# Patient Record
Sex: Male | Born: 1970 | Race: Asian | Hispanic: No | Marital: Married | Smoking: Former smoker
Health system: Southern US, Community
[De-identification: ages and names within clinical notes are randomized; demographics above are authoritative.]

## PROBLEM LIST (undated history)

## (undated) DIAGNOSIS — I10 Essential (primary) hypertension: Secondary | ICD-10-CM

## (undated) DIAGNOSIS — B1910 Unspecified viral hepatitis B without hepatic coma: Secondary | ICD-10-CM

## (undated) DIAGNOSIS — R079 Chest pain, unspecified: Secondary | ICD-10-CM

## (undated) DIAGNOSIS — K7460 Unspecified cirrhosis of liver: Secondary | ICD-10-CM

## (undated) HISTORY — DX: Unspecified viral hepatitis B without hepatic coma: K74.60

## (undated) HISTORY — DX: Unspecified viral hepatitis B without hepatic coma: B19.10

## (undated) HISTORY — DX: Essential (primary) hypertension: I10

## (undated) HISTORY — DX: Chest pain, unspecified: R07.9

---

## 2001-03-20 ENCOUNTER — Emergency Department (HOSPITAL_COMMUNITY): Admission: EM | Admit: 2001-03-20 | Discharge: 2001-03-20 | Payer: Self-pay | Admitting: *Deleted

## 2002-05-04 ENCOUNTER — Emergency Department (HOSPITAL_COMMUNITY): Admission: EM | Admit: 2002-05-04 | Discharge: 2002-05-04 | Payer: Self-pay | Admitting: Emergency Medicine

## 2002-05-15 ENCOUNTER — Emergency Department (HOSPITAL_COMMUNITY): Admission: EM | Admit: 2002-05-15 | Discharge: 2002-05-15 | Payer: Self-pay | Admitting: Emergency Medicine

## 2005-12-21 ENCOUNTER — Ambulatory Visit (HOSPITAL_COMMUNITY): Admission: RE | Admit: 2005-12-21 | Discharge: 2005-12-21 | Payer: Self-pay | Admitting: Gastroenterology

## 2006-12-11 ENCOUNTER — Emergency Department (HOSPITAL_COMMUNITY): Admission: EM | Admit: 2006-12-11 | Discharge: 2006-12-12 | Payer: Self-pay | Admitting: Emergency Medicine

## 2012-03-06 ENCOUNTER — Ambulatory Visit (INDEPENDENT_AMBULATORY_CARE_PROVIDER_SITE_OTHER): Payer: 59 | Admitting: Family Medicine

## 2012-03-06 VITALS — BP 126/84 | HR 72 | Temp 97.5°F | Resp 16 | Ht 62.75 in | Wt 140.0 lb

## 2012-03-06 DIAGNOSIS — J029 Acute pharyngitis, unspecified: Secondary | ICD-10-CM

## 2012-03-06 LAB — POCT RAPID STREP A (OFFICE): Rapid Strep A Screen: NEGATIVE

## 2012-03-06 MED ORDER — FLUTICASONE PROPIONATE 50 MCG/ACT NA SUSP: 2.0000 | Freq: Every day | NASAL | Status: DC

## 2012-03-06 NOTE — Progress Notes (Signed)
  Patient Name: Brian Preston Date of Birth: July 31, 1971 Medical Record Number: [id number] Gender: male Date of Encounter: 03/06/2012  History of Present Illness:  Brian Preston is a 41 y.o. very pleasant male patient who presents with the following:  Here with ST for 2 weeks.  Hurts the most in the morning and at night.  No sneezing, no itchy eyes, no cough, "a little bit" of subjective fever.  No nausea or vomiting. He tried zyrtec thinking that he might have allergies but this did not help.  Otherwise he is generally healthy and has no other issues currently  There is no problem list on file for this patient.  No past medical history on file. No past surgical history on file. History  Substance Use Topics  . Smoking status: Former Games developer  . Smokeless tobacco: Not on file  . Alcohol Use: Not on file   No family history on file. No Known Allergies  Medication list has been reviewed and updated.  Review of Systems: As per HPI- otherwise negative.   Physical Examination: Filed Vitals:   03/06/12 1255  BP: 126/84  Pulse: 72  Temp: 97.5 F (36.4 C)  TempSrc: Oral  Resp: 16  Height: 5' 2.75" (1.594 m)  Weight: 140 lb (63.504 kg)    Body mass index is 25.00 kg/(m^2).  GEN: WDWN, NAD, Non-toxic, A & O x 3 HEENT: Atraumatic, Normocephalic. Neck supple. No masses, No LAD.  Tm and oropharynx wnl.  No exudate or redness of tonsils Ears and Nose: No external deformity. CV: RRR, No M/G/R. No JVD. No thrill. No extra heart sounds. PULM: CTA B, no wheezes, crackles, rhonchi. No retractions. No resp. distress. No accessory muscle use EXTR: No c/c/e NEURO Normal gait.  PSYCH: Normally interactive. Conversant. Not depressed or anxious appearing.  Calm demeanor.  Results for orders placed in visit on 03/06/12  POCT RAPID STREP A (OFFICE)      Component Value Range   Rapid Strep A Screen Negative  Negative     Assessment and Plan: 1. Acute pharyngitis  POCT rapid strep A, fluticasone  (FLONASE) 50 MCG/ACT nasal spray  2. Allergic rhinitis     Suspect allergies are indeed the cause of ST.  Gave Rx for DMM, and flonase as above.  Let me know if not better in a few days- Sooner if worse.

## 2012-04-17 ENCOUNTER — Ambulatory Visit (INDEPENDENT_AMBULATORY_CARE_PROVIDER_SITE_OTHER): Payer: 59 | Admitting: Family Medicine

## 2012-04-17 VITALS — BP 103/71 | HR 109 | Temp 98.6°F | Resp 16 | Ht 62.0 in | Wt 137.0 lb

## 2012-04-17 DIAGNOSIS — R112 Nausea with vomiting, unspecified: Secondary | ICD-10-CM

## 2012-04-17 LAB — POCT CBC
Granulocyte percent: 81.3 %G — AB (ref 37–80)
HCT, POC: 41.1 % — AB (ref 43.5–53.7)
Hemoglobin: 13.3 g/dL — AB (ref 14.1–18.1)
Lymph, poc: 1.8 (ref 0.6–3.4)
MCH, POC: 28.7 pg (ref 27–31.2)
MCHC: 32.4 g/dL (ref 31.8–35.4)
MCV: 88.6 fL (ref 80–97)
MID (cbc): 0.9 (ref 0–0.9)
MPV: 9.3 fL (ref 0–99.8)
POC Granulocyte: 11.9 — AB (ref 2–6.9)
POC LYMPH PERCENT: 12.2 %L (ref 10–50)
POC MID %: 6.5 %M (ref 0–12)
Platelet Count, POC: 241 10*3/uL (ref 142–424)
RBC: 4.64 M/uL — AB (ref 4.69–6.13)
RDW, POC: 14.1 %
WBC: 14.6 10*3/uL — AB (ref 4.6–10.2)

## 2012-04-17 LAB — GLUCOSE, POCT (MANUAL RESULT ENTRY): POC Glucose: 84 mg/dl (ref 70–99)

## 2012-04-17 MED ORDER — ONDANSETRON 8 MG PO TBDP: 8.0000 mg | ORAL_TABLET | Freq: Three times a day (TID) | ORAL | Status: AC | PRN

## 2012-04-17 MED ORDER — SODIUM CHLORIDE 0.9 % IV SOLN: 4.0000 mg | Freq: Once | INTRAVENOUS | Status: DC

## 2012-04-17 NOTE — Progress Notes (Signed)
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 He's having some crampy abdominal pain and feels dizzy when he stands up.  He's noted no blood in either vomitus or his diarrhea and his abdominal pain is tolerable, but he does feel weak and dizzy when standing.  He has no cough, sore throat, chest pain, or headache  Objective: Patient appears acutely ill and is lying supine on the exam table HEENT: Unremarkable Chest: Clear Heart: Rapid, no murmur or gallop Abdomen: Hyperactive bowel sounds, soft with mild tenderness diffusely, no masses, no HSM Skin: No rash, ecchymosis Neurological: Neck is supple, patient is alert and cooperative, cranial nerves III through XII are intact  Results for orders placed in visit on 04/17/12  POCT CBC      Component Value Range   WBC 14.6 (*) 4.6 - 10.2 (K/uL)   Lymph, poc 1.8  0.6 - 3.4    POC LYMPH PERCENT 12.2  10 - 50 (%L)   MID (cbc) 0.9  0 - 0.9    POC MID % 6.5  0 - 12 (%M)   POC Granulocyte 11.9 (*) 2 - 6.9    Granulocyte percent 81.3 (*) 37 - 80 (%G)   RBC 4.64 (*) 4.69 - 6.13 (M/uL)   Hemoglobin 13.3 (*) 14.1 - 18.1 (g/dL)   HCT, POC 16.1 (*) 09.6 - 53.7 (%)   MCV 88.6  80 - 97 (fL)   MCH, POC 28.7  27 - 31.2 (pg)   MCHC 32.4  31.8 - 35.4 (g/dL)   RDW, POC 04.5     Platelet Count, POC 241  142 - 424 (K/uL)   MPV 9.3  0 - 99.8 (fL)  GLUCOSE, POCT (MANUAL RESULT ENTRY)      Component Value Range   POC Glucose 84  70 - 99 (mg/dl)   Assessment: Acute dehydration with gastroenteritis. No reflex present at this particular time.  Plan: Keep out of work for next 24 hours, clear liquids, Zofran for control of nausea.  Patient told to return if he develops bleeding per rectum or per os, 1. Nausea, vomiting, and diarrhea  POCT CBC, POCT glucose (manual entry), Comprehensive metabolic panel, ondansetron (ZOFRAN) 4 mg in sodium chloride 0.9 % 50 mL IVPB, ondansetron (ZOFRAN  ODT) 8 MG disintegrating tablet

## 2012-04-17 NOTE — Patient Instructions (Signed)
Bu?n Nn v Nn  (Nausea and Vomiting) Bu?n nn l m?t c?m gic b? b?nh th??ng xu?t hi?n tr??c khi nn (i m?a). Nn l m?t ph?n x? trong ? cc ch?t trong d? dy ?i ra kh?i mi?ng c?a b?n. Nn c th? gy ra m?t d?ch c? th? nghim tr?ng (m?t n??c). Tr? em v ng??i cao tu?i c th? b? m?t n??c nhanh chng, ??c bi?t n?u h? b? c? tiu ch?y. Bu?n nn v nn l tri?u ch?ng c?a m?t tnh tr?ng ho?c b?nh. ?i?u quan tr?ng l tm ra nguyn nhn c?a cc tri?u ch?ng.  NGUYN NHN   Tr?c ti?p kch thch nim m?c d? dy. S? kch thch ny c th? do t?ng l??ng axit (tro ng??c d? dy b?nh), nhi?m trng, ng? ??c th?c ph?m, dng m?t s? lo?i thu?c (nh? thu?c ch?ng vim khng c steroid), s? d?ng r??u ho?c s? d?ng thu?c l.   Tn hi?u t? no. Nh?ng tn hi?u ny c th? gy ra b?i ?au ??u, ti?p xc v?i nhi?t, r?i lo?n tai trong, t?ng p l?c trong no sau ch?n th??ng, nhi?m trng, kh?i u ho?c ch?n ??ng, ?au, kch thch c?m xc ho?c cc v?n ?? trao ??i ch?t.   S? t?c ngh?n ? ???ng tiu ha (t?c ngh?n ru?t).   Cc b?nh nh? ti?u ???ng, vim gan, v?n ?? v? ti m?t, vim ru?t th?a, v?n ?? v? th?n, ung th?, nhi?m khu?n huy?t, cc tri?u ch?ng khng ?i?n hnh c?a m?t c?n ?au tim ho?c r?i lo?n ?n u?ng.   ?i?u tr? y t? nh? ha tr? v x? tr?.   Nh?n thu?c lm cho b?n ng? (gy m ton thn) trong khi ph?u thu?t.  CH?N ?ON  Chuyn gia ch?m sc y t? c th? yu c?u ti?n hnh cc xt nghi?m n?u v?n ?? khng c?i thi?n sau m?t vi ngy. Cc xt nghi?m c?ng c th? ???c th?c hi?n n?u c cc tri?u ch?ng n?ng ho?c n?u l do gy bu?n nn v nn khng r rng. Cc xt nghi?m c th? bao g?m:   Xt nghi?m n??c ti?u.   Xt nghi?m mu.   Xt nghi?m phn.   C?y m?u (?? tm b?ng ch?ng nhi?m trng).   X-quang ho?c cc ch?n ?on hnh ?nh khc.  K?t qu? xt nghi?m c th? gip chuyn gia ch?m sc y t? c?a b?n ??a ra Gamble?t ??nh v? ?i?u tr? ho?c s? c?n thi?t ph?i th?c hi?n thm cc xt nghi?m khc.  ?I?U TR?  B?n c?n gi? ? tr?ng thi ng?m n??c  t?t. U?ng th??ng xuyn nh?ng v?i s? l??ng nh?. B?n c th? mu?n u?ng n??c, ?? u?ng th? thao, n??c dng trong, ho?c ?n kem ?ng l?nh ho?c mn trng mi?ng gelatin ?? gi? ng?m n??c. Khi b?n ?n, u?ng ch?m c th? gip ng?n ng?a bu?n nn. Ngoi ra cn c m?t s? thu?c ch?ng nn c th? gip ng?n ng?a bu?n nn.  H??NG D?N CH?M SC T?I NH   S? d?ng t?t c? thu?c theo ch? d?n c?a chuyn gia ch?m sc y t?.   N?u b?n khng thm ?n, khng p bu?c mnh ?n. Tuy nhin, b?n ph?i ti?p t?c u?ng n??c.   N?u b?n thm ?n, hy ?n m?t ch? ?? ?n bnh th??ng, tr? khi chuyn gia ch?m sc y t? c?a b?n c ch? d?n khc.   ?n nhi?u lo?i hi?rat [place]bon ph?c t?p (g?o, la m, khoai ty, bnh m), th?t n?c, s?a chua, tri cy v rau qu?.   Trnh   cc lo?i th?c ph?m c hm l??ng ch?t bo cao v chng kh tiu ha h?n.   U?ng ?? n??c v dung d?ch ?? n??c ti?u trong ho?c c mu vng nh?t.   N?u b?n b? m?t n??c, hy h?i chuyn gia ch?m sc y t? c?a b?n ?? ???c h??ng d?n b n??c c? th?. D?u hi?u m?t n??c c th? bao g?m:   R?t kht.   Mi v mi?ng kh.   Hoa m?t.   N??c ti?u ??m mu.   Gi?m t?n su?t v l??ng n??c ti?u.   B? l?n.   Th? d?c ho?c nh?p ??p nhanh.  HY NGAY L?P T?C THAM V?N V?I CHUYN GIA Y T? N?U:   C mu ho?c ??m nu (gi?ng nh? b c ph) trong ch?t nn c?a b?n.   Phn b?n c mu ?en ho?c c mu.   B?n b? ?au ??u ho?c c?ng c? n?ng.   B?n b? l?n.   B?n b? ?au b?ng n?ng.   B?n b? ?au ng?c ho?c kh th?.   B?n khng ?i ti?u t nh?t m?t l?n m?i 8 gi?.   B?n pht tri?n da l?nh.   B?n ti?p t?c nn ko di h?n 24 ??n 48 gi?.   B?n b? s?t.  ??M B?O B?N:   Hi?u cc h??ng d?n ny.   S? theo di tnh tr?ng c?a mnh.   S? yu c?u tr? gip ngay l?p t?c n?u b?n c?m th?y khng kh?e ho?c tnh tr?ng tr? nn t?i h?n.  Document Released: 05/23/2011 Document Revised: 10/19/2011 Exit[employer] Patient Information 2012 [employer]. 

## 2012-04-18 LAB — COMPREHENSIVE METABOLIC PANEL
ALT: 38 U/L (ref 0–53)
AST: 17 U/L (ref 0–37)
Albumin: 3.1 g/dL — ABNORMAL LOW (ref 3.5–5.2)
Alkaline Phosphatase: 43 U/L (ref 39–117)
BUN: 17 mg/dL (ref 6–23)
CO2: 27 mEq/L (ref 19–32)
Calcium: 7.5 mg/dL — ABNORMAL LOW (ref 8.4–10.5)
Chloride: 107 mEq/L (ref 96–112)
Creat: 0.72 mg/dL (ref 0.50–1.35)
Glucose, Bld: 87 mg/dL (ref 70–99)
Potassium: 3.8 mEq/L (ref 3.5–5.3)
Sodium: 140 mEq/L (ref 135–145)
Total Bilirubin: 0.7 mg/dL (ref 0.3–1.2)
Total Protein: 5.5 g/dL — ABNORMAL LOW (ref 6.0–8.3)

## 2012-11-22 ENCOUNTER — Ambulatory Visit (INDEPENDENT_AMBULATORY_CARE_PROVIDER_SITE_OTHER): Payer: 59 | Admitting: Emergency Medicine

## 2012-11-22 VITALS — BP 120/84 | HR 86 | Temp 98.0°F | Resp 16 | Ht 62.5 in | Wt 144.0 lb

## 2012-11-22 DIAGNOSIS — R509 Fever, unspecified: Secondary | ICD-10-CM

## 2012-11-22 DIAGNOSIS — J111 Influenza due to unidentified influenza virus with other respiratory manifestations: Secondary | ICD-10-CM

## 2012-11-22 DIAGNOSIS — R05 Cough: Secondary | ICD-10-CM

## 2012-11-22 DIAGNOSIS — R059 Cough, unspecified: Secondary | ICD-10-CM

## 2012-11-22 DIAGNOSIS — J029 Acute pharyngitis, unspecified: Secondary | ICD-10-CM

## 2012-11-22 LAB — POCT INFLUENZA A/B: Influenza B, POC: NEGATIVE

## 2012-11-22 LAB — POCT CBC
HCT, POC: 48 % (ref 43.5–53.7)
Lymph, poc: 2.9 (ref 0.6–3.4)
MCHC: 31 g/dL — AB (ref 31.8–35.4)
MCV: 91 fL (ref 80–97)
POC LYMPH PERCENT: 29.5 %L (ref 10–50)
RDW, POC: 13.4 %

## 2012-11-22 LAB — POCT RAPID STREP A (OFFICE): Rapid Strep A Screen: NEGATIVE

## 2012-11-22 MED ORDER — OSELTAMIVIR PHOSPHATE 75 MG PO CAPS: 75.0000 mg | ORAL_CAPSULE | Freq: Two times a day (BID) | ORAL | Status: DC

## 2012-11-22 NOTE — Progress Notes (Signed)
Subjective:    Patient ID: Brian Preston, male    DOB: 16-Aug-1971, 42 y.o.   MRN: [id number]  HPI patient enters with flulike symptoms of head congestion sore throat dry cough associated with fever chills and myalgias. He states he did have a flu shot this year     Review of Systems     Objective:   Physical Exam HEENT exam reveals an ill-appearing but alert cooperative male who is not in distress neck is supple. Chest is clear to auscultation and percussion. Cardiac is regular rate without murmurs or gallops  Flu test + Results for orders placed in visit on 04/17/12  POCT CBC      Component Value Range   WBC 14.6 (*) 4.6 - 10.2 K/uL   Lymph, poc 1.8  0.6 - 3.4   POC LYMPH PERCENT 12.2  10 - 50 %L   MID (cbc) 0.9  0 - 0.9   POC MID % 6.5  0 - 12 %M   POC Granulocyte 11.9 (*) 2 - 6.9   Granulocyte percent 81.3 (*) 37 - 80 %G   RBC 4.64 (*) 4.69 - 6.13 M/uL   Hemoglobin 13.3 (*) 14.1 - 18.1 g/dL   HCT, POC 10.2 (*) 72.5 - 53.7 %   MCV 88.6  80 - 97 fL   MCH, POC 28.7  27 - 31.2 pg   MCHC 32.4  31.8 - 35.4 g/dL   RDW, POC 36.6     Platelet Count, POC 241  142 - 424 K/uL   MPV 9.3  0 - 99.8 fL  GLUCOSE, POCT (MANUAL RESULT ENTRY)      Component Value Range   POC Glucose 84  70 - 99 mg/dl  COMPREHENSIVE METABOLIC PANEL      Component Value Range   Sodium 140  135 - 145 mEq/L   Potassium 3.8  3.5 - 5.3 mEq/L   Chloride 107  96 - 112 mEq/L   CO2 27  19 - 32 mEq/L   Glucose, Bld 87  70 - 99 mg/dL   BUN 17  6 - 23 mg/dL   Creat 4.40  3.47 - 4.25 mg/dL   Total Bilirubin 0.7  0.3 - 1.2 mg/dL   Alkaline Phosphatase 43  39 - 117 U/L   AST 17  0 - 37 U/L   ALT 38  0 - 53 U/L   Total Protein 5.5 (*) 6.0 - 8.3 g/dL   Albumin 3.1 (*) 3.5 - 5.2 g/dL   Calcium 7.5 (*) 8.4 - 10.5 mg/dL   Results for orders placed in visit on 11/22/12  POCT INFLUENZA A/B      Component Value Range   Influenza A, POC Negative     Influenza B, POC Negative    POCT RAPID STREP A (OFFICE)   Component Value Range   Rapid Strep A Screen Negative  Negative   Results for orders placed in visit on 11/22/12  POCT INFLUENZA A/B      Component Value Range   Influenza A, POC Negative     Influenza B, POC Negative    POCT RAPID STREP A (OFFICE)      Component Value Range   Rapid Strep A Screen Negative  Negative  POCT CBC      Component Value Range   WBC 9.8  4.6 - 10.2 K/uL   Lymph, poc 2.9  0.6 - 3.4   POC LYMPH PERCENT 29.5  10 - 50 %L   MID (  cbc) 0.7  0 - 0.9   POC MID % 7.3  0 - 12 %M   POC Granulocyte 6.2  2 - 6.9   Granulocyte percent 63.2  37 - 80 %G   RBC 5.28  4.69 - 6.13 M/uL   Hemoglobin 14.9  14.1 - 18.1 g/dL   HCT, POC 78.4  69.6 - 53.7 %   MCV 91.0  80 - 97 fL   MCH, POC 28.2  27 - 31.2 pg   MCHC 31.0 (*) 31.8 - 35.4 g/dL   RDW, POC 29.5     Platelet Count, POC 296  142 - 424 K/uL   MPV 9.5  0 - 99.8 fL      Assessment & Plan:    Patient here with flu symptoms. Has negative flu test but his white count is normal. We'll treat with Tamiflu

## 2012-11-22 NOTE — Patient Instructions (Addendum)
Influenza, Adult Influenza ("the flu") is a viral infection of the respiratory tract. It occurs more often in winter months because people spend more time in close contact with one another. Influenza can make you feel very sick. Influenza easily spreads from person to person (contagious). CAUSES   Influenza is caused by a virus that infects the respiratory tract. You can catch the virus by breathing in droplets from an infected person's cough or sneeze. You can also catch the virus by touching something that was recently contaminated with the virus and then touching your mouth, nose, or eyes. SYMPTOMS   Symptoms typically last 4 to 10 days and may include:  Fever.   Chills.   Headache, body aches, and muscle aches.   Sore throat.   Chest discomfort and cough.   Poor appetite.   Weakness or feeling tired.   Dizziness.   Nausea or vomiting.  DIAGNOSIS   Diagnosis of influenza is often made based on your history and a physical exam. A nose or throat swab test can be done to confirm the diagnosis. RISKS AND COMPLICATIONS You may be at risk for a more severe case of influenza if you smoke cigarettes, have diabetes, have chronic heart disease (such as heart failure) or lung disease (such as asthma), or if you have a weakened immune system. Elderly people and pregnant women are also at risk for more serious infections. The most common complication of influenza is a lung infection (pneumonia). Sometimes, this complication can require emergency medical care and may be life-threatening. PREVENTION   An annual influenza vaccination (flu shot) is the best way to avoid getting influenza. An annual flu shot is now routinely recommended for all adults in the [place] TREATMENT   In mild cases, influenza goes away on its own. Treatment is directed at relieving symptoms. For more severe cases, your caregiver may prescribe antiviral medicines to shorten the sickness. Antibiotic medicines are not effective,  because the infection is caused by a virus, not by bacteria. HOME CARE INSTRUCTIONS  Only take over-the-counter or prescription medicines for pain, discomfort, or fever as directed by your caregiver.   Use a cool mist humidifier to make breathing easier.   Get plenty of rest until your temperature returns to normal. This usually takes 3 to 4 days.   Drink enough fluids to keep your urine clear or pale yellow.   Cover your mouth and nose when coughing or sneezing, and wash your hands well to avoid spreading the virus.   Stay home from work or school until your fever has been gone for at least 1 full day.  SEEK MEDICAL CARE IF:    You have chest pain or a deep cough that worsens or produces more mucus.   You have nausea, vomiting, or diarrhea.  SEEK IMMEDIATE MEDICAL CARE IF:    You have difficulty breathing, shortness of breath, or your skin or nails turn bluish.   You have severe neck pain or stiffness.   You have a severe headache, facial pain, or earache.   You have a worsening or recurring fever.   You have nausea or vomiting that cannot be controlled.  MAKE SURE YOU:  Understand these instructions.   Will watch your condition.   Will get help right away if you are not doing well or get worse.  Document Released: 10/27/2000 Document Revised: 04/30/2012 Document Reviewed: 01/29/2012 Exit[employer] Patient Information 2013 [employer].    

## 2013-03-03 ENCOUNTER — Ambulatory Visit (INDEPENDENT_AMBULATORY_CARE_PROVIDER_SITE_OTHER): Payer: 59 | Admitting: Family Medicine

## 2013-03-03 DIAGNOSIS — M79609 Pain in unspecified limb: Secondary | ICD-10-CM

## 2013-03-03 DIAGNOSIS — M79601 Pain in right arm: Secondary | ICD-10-CM

## 2013-03-03 DIAGNOSIS — Z0000 Encounter for general adult medical examination without abnormal findings: Secondary | ICD-10-CM

## 2013-03-03 MED ORDER — TRAMADOL HCL 50 MG PO TABS: 50.0000 mg | ORAL_TABLET | Freq: Every day | ORAL | Status: DC

## 2013-03-03 NOTE — Patient Instructions (Signed)
It was good to meet you.    We will let you know the lab results.

## 2013-03-03 NOTE — Progress Notes (Signed)
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  Left eye twitching:  Present for past 3 months.  Occurs intermittently throughout the day.  No eye pain, no eye redness, no changes in vision/diplopia.  Does not wear glasses.    2.  Pain:  Present for past several weeks.  Complains of intermittent "bone pain" in legs and arms.  Exacerbated with movement.  No pain in AM when awakens, no pain on days he does not work.  Currently he has Right arm distal to elbow but below shoulder and extending into forearm.  No injury.  Also with BL legs which are painful, but this is dull.  Both arm and legs described as "ache."  Has tried OTC Tylenol with helps at times and also oil massages from his wife which help.  No weight loss, night sweats, fevers/chills.  No change in energy levels.   Also asking for complete physical exam.    PMH reviewed.  History reviewed. No pertinent past medical history. History reviewed. No pertinent past surgical history.  Medications reviewed. Current Outpatient Prescriptions  Medication Sig Dispense Refill  . fluticasone (FLONASE) 50 MCG/ACT nasal spray Place 2 sprays into the nose daily.  16 g  6  . oseltamivir (TAMIFLU) 75 MG capsule Take 1 capsule (75 mg total) by mouth 2 (two) times daily.  10 capsule  0   Current Facility-Administered Medications  Medication Dose Route Frequency Provider Last Rate Last Dose  . ondansetron (ZOFRAN) 4 mg in sodium chloride 0.9 % 50 mL IVPB  4 mg Intravenous Once Elvina Sidle, MD        ROS as above otherwise neg.  No chest pain, palpitations, SOB, Fever, Chills, Abd pain, N/V/D.   Physical Exam:  BP 130/90  Pulse 83  Temp(Src) 98.1 F (36.7 C) (Oral)  Resp 16  Ht 5\' 2"  (1.575 m)  Wt 145 lb (65.772 kg)  BMI 26.51 kg/m2  SpO2 98% Gen:  Alert, cooperative patient who appears stated age in no acute distress.  Vital signs  reviewed. HEENT:  [place]/AT.  EOMI, PERRL.  MMM, tonsils non-erythematous, non-edematous.  External ears WNL, Bilateral TM's normal without retraction, redness or bulging.  Pulm:  Clear to auscultation bilaterally with good air movement.  No wheezes or rales noted.   Cardiac:  Regular rate and rhythm without murmur auscultated.  Good S1/S2. Abd:  Soft/nondistended/nontender.  Good bowel sounds throughout all four quadrants.  No masses noted.  Exts: Non edematous BL  LE, warm and well perfused.  Skin:  No lesions noted MSK:  Strength 4/5 RUE limited by pain with elbow flexion and extension.  Nontender shoulder, nontender elbow.  TTP humerus and forearm.   LUE WNL and strength 5/5 BL Legs strength 5/5, nontender curently Psych:  Pleasant, conversant, not depressed or anxious appearing.  Neuro:  No focal deficits noted, ambulates well without limp.    Assessment and Plan:  1.  Eye twitching: - Vision check revealed: 20/20 in both eyes.   - Likely nervous twitch, no other symptoms noted, no red flags.   - FU prn.    2.  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  No pain on days which he does not work. - No red flags, no signs of systemic or autoimmune disease - Tramadol PRN for relief -  CMET as below.   3.  Hypocalcemia: - With concurent hypoalbuminemia found 1 year ago, corrected calcium only 8.2 which is just barely low.   - Repeat today.   - Also checking vitamin D - Not likely that this is causing #2 above but will await results.

## 2013-03-04 LAB — COMPREHENSIVE METABOLIC PANEL
Albumin: 4 g/dL (ref 3.5–5.2)
Alkaline Phosphatase: 57 U/L (ref 39–117)
BUN: 15 mg/dL (ref 6–23)
Glucose, Bld: 88 mg/dL (ref 70–99)
Total Bilirubin: 0.4 mg/dL (ref 0.3–1.2)

## 2013-03-04 LAB — CBC WITH DIFFERENTIAL/PLATELET
Basophils Relative: 0 % (ref 0–1)
Eosinophils Absolute: 0.5 10*3/uL (ref 0.0–0.7)
HCT: 45 % (ref 39.0–52.0)
Hemoglobin: 15.2 g/dL (ref 13.0–17.0)
MCH: 29 pg (ref 26.0–34.0)
MCHC: 33.8 g/dL (ref 30.0–36.0)
MCV: 85.7 fL (ref 78.0–100.0)
Monocytes Absolute: 0.7 10*3/uL (ref 0.1–1.0)
Monocytes Relative: 8 % (ref 3–12)

## 2013-03-05 LAB — VITAMIN D 25 HYDROXY (VIT D DEFICIENCY, FRACTURES): Vit D, 25-Hydroxy: 27 ng/mL — ABNORMAL LOW (ref 30–89)

## 2013-03-10 ENCOUNTER — Encounter: Payer: Self-pay | Admitting: Family Medicine

## 2013-03-21 ENCOUNTER — Encounter: Payer: 59 | Admitting: Family Medicine

## 2014-01-13 ENCOUNTER — Ambulatory Visit (INDEPENDENT_AMBULATORY_CARE_PROVIDER_SITE_OTHER): Payer: PRIVATE HEALTH INSURANCE | Admitting: Internal Medicine

## 2014-01-13 VITALS — BP 114/88 | HR 100 | Temp 99.4°F | Resp 18 | Wt 142.0 lb

## 2014-01-13 DIAGNOSIS — R05 Cough: Secondary | ICD-10-CM

## 2014-01-13 DIAGNOSIS — R509 Fever, unspecified: Secondary | ICD-10-CM

## 2014-01-13 DIAGNOSIS — R059 Cough, unspecified: Secondary | ICD-10-CM

## 2014-01-13 LAB — POCT CBC
GRANULOCYTE PERCENT: 69.5 % (ref 37–80)
HEMATOCRIT: 47.9 % (ref 43.5–53.7)
Hemoglobin: 15.4 g/dL (ref 14.1–18.1)
Lymph, poc: 1.3 (ref 0.6–3.4)
MCH, POC: 29.1 pg (ref 27–31.2)
MCHC: 32.2 g/dL (ref 31.8–35.4)
MCV: 90.5 fL (ref 80–97)
MID (CBC): 0.8 (ref 0–0.9)
MPV: 9.1 fL (ref 0–99.8)
PLATELET COUNT, POC: 283 10*3/uL (ref 142–424)
POC GRANULOCYTE: 4.7 (ref 2–6.9)
POC LYMPH %: 18.7 % (ref 10–50)
POC MID %: 11.8 %M (ref 0–12)
RBC: 5.29 M/uL (ref 4.69–6.13)
RDW, POC: 14.3 %
WBC: 6.8 10*3/uL (ref 4.6–10.2)

## 2014-01-13 MED ORDER — OSELTAMIVIR PHOSPHATE 75 MG PO CAPS: 75.0000 mg | ORAL_CAPSULE | Freq: Two times a day (BID) | ORAL | Status: DC

## 2014-01-13 MED ORDER — HYDROCODONE-HOMATROPINE 5-1.5 MG/5ML PO SYRP
5.0000 mL | ORAL_SOLUTION | Freq: Four times a day (QID) | ORAL | Status: DC | PRN
Start: 2014-01-13 — End: 2014-04-27

## 2014-01-13 NOTE — Progress Notes (Signed)
   Subjective:    Patient ID: Brian Preston, male    DOB: 05-31-1971, 43 y.o.   MRN: [id number] This chart was scribed for Ellamae Siaobert Doolittle, MD by Nicholos Johnsenise Iheanachor, Medical Scribe. This patient's care was started at 6:07 PM.  Headache  Associated symptoms include coughing and a fever.  Emesis  Associated symptoms include coughing and a fever.  Cough Associated symptoms include a fever.   HPI Comments: Brian Preston is a 43 y.o. male who [facility] complaining of fever, chills, cough, HA, and generalized body aches; onset yesterday. Pt received a flu shot this year. Denies trouble swallowing.   Review of Systems  Constitutional: Positive for fever.  HENT: Negative for trouble swallowing.   Respiratory: Positive for cough.    Objective:   Physical Exam  Nursing note and vitals reviewed. Constitutional: He is oriented to person, place, and time. He appears well-developed and well-nourished. No distress.  HENT:  Head: Normocephalic and atraumatic.  Right Ear: External ear normal.  Left Ear: External ear normal.  Nose: Nose normal.  Mouth/Throat: Oropharynx is clear and moist.  Eyes: EOM are normal.  Neck: Neck supple. No tracheal deviation present.  Cardiovascular: Normal rate, regular rhythm and normal heart sounds.  Exam reveals no gallop and no friction rub.   No murmur heard. Pulmonary/Chest: Effort normal and breath sounds normal. No respiratory distress. He has no wheezes. He has no rales.  Musculoskeletal: Normal range of motion.  Lymphadenopathy:    He has no cervical adenopathy.  Neurological: He is alert and oriented to person, place, and time.  Skin: Skin is warm and dry.  Psychiatric: He has a normal mood and affect. His behavior is normal.   Assessment & Plan:  I have completed the patient encounter in its entirety as documented by the scribe, with editing by me where necessary. Robert P. Merla Richesoolittle, M.D. Cough - Plan: POCT CBC  Fever - Plan: POCT CBC  this  presentation suggest influenza attenuated by the vaccine/very similar to his last presentation in January Meds ordered this encounter  Medications  . HYDROcodone-homatropine (HYCODAN) 5-1.5 MG/5ML syrup    Sig: Take 5 mLs by mouth every 6 (six) hours as needed for cough.    Dispense:  120 mL    Refill:  0  . oseltamivir (TAMIFLU) 75 MG capsule    Sig: Take 1 capsule (75 mg total) by mouth 2 (two) times daily.    Dispense:  10 capsule    Refill:  0

## 2014-04-27 ENCOUNTER — Other Ambulatory Visit: Payer: Self-pay | Admitting: Family Medicine

## 2014-04-27 ENCOUNTER — Ambulatory Visit (INDEPENDENT_AMBULATORY_CARE_PROVIDER_SITE_OTHER): Payer: PRIVATE HEALTH INSURANCE | Admitting: Family Medicine

## 2014-04-27 VITALS — BP 118/78 | HR 76 | Temp 97.4°F | Resp 18 | Ht 61.75 in | Wt 139.0 lb

## 2014-04-27 DIAGNOSIS — Z0000 Encounter for general adult medical examination without abnormal findings: Secondary | ICD-10-CM

## 2014-04-27 LAB — CBC
HCT: 43.9 % (ref 39.0–52.0)
HEMOGLOBIN: 15.1 g/dL (ref 13.0–17.0)
MCH: 29 pg (ref 26.0–34.0)
MCHC: 34.4 g/dL (ref 30.0–36.0)
MCV: 84.3 fL (ref 78.0–100.0)
PLATELETS: 295 10*3/uL (ref 150–400)
RBC: 5.21 MIL/uL (ref 4.22–5.81)
RDW: 14 % (ref 11.5–15.5)
WBC: 8.6 10*3/uL (ref 4.0–10.5)

## 2014-04-27 LAB — LIPID PANEL
CHOLESTEROL: 205 mg/dL — AB (ref 0–200)
HDL: 42 mg/dL (ref >39)
LDL Cholesterol: 138 mg/dL — ABNORMAL HIGH (ref 0–99)
Total CHOL/HDL Ratio: 4.9 Ratio
Triglycerides: 124 mg/dL (ref <150)
VLDL: 25 mg/dL (ref 0–40)

## 2014-04-27 LAB — COMPREHENSIVE METABOLIC PANEL
ALBUMIN: 4 g/dL (ref 3.5–5.2)
ALT: 92 U/L — AB (ref 0–53)
AST: 43 U/L — ABNORMAL HIGH (ref 0–37)
Alkaline Phosphatase: 52 U/L (ref 39–117)
BUN: 13 mg/dL (ref 6–23)
CALCIUM: 9.2 mg/dL (ref 8.4–10.5)
CHLORIDE: 105 meq/L (ref 96–112)
CO2: 28 meq/L (ref 19–32)
Creat: 0.82 mg/dL (ref 0.50–1.35)
Glucose, Bld: 85 mg/dL (ref 70–99)
Potassium: 5.4 mEq/L — ABNORMAL HIGH (ref 3.5–5.3)
SODIUM: 141 meq/L (ref 135–145)
TOTAL PROTEIN: 6.8 g/dL (ref 6.0–8.3)
Total Bilirubin: 0.3 mg/dL (ref 0.2–1.2)

## 2014-04-27 NOTE — Progress Notes (Signed)
[facility] [address], [place] [address] [phone]  Date:  04/27/2014   Name:  Brian Preston   DOB:  10-01-1971   MRN:  [id number]  PCP:  No PCP Per Patient    Chief Complaint: Annual Exam   History of Present Illness:  Brian MinkQuy Kromer is a 43 y.o. very pleasant male patient who presents with the following:  Here today for a physical exam.  Last CMP about one year ago.  He did eat a little bit this am.  He is not sure of the date of his last tetanus, but believes it was about 2 years ago  He is married, does not smoke, drinks little and does exercise.  He is a Estate agentforklift operator  There are no active problems to display for this patient.   History reviewed. No pertinent past medical history.  History reviewed. No pertinent past surgical history.  History  Substance Use Topics  . Smoking status: Former Smoker    Quit date: 11/22/1992  . Smokeless tobacco: Not on file  . Alcohol Use: 0.6 oz/week    1 Cans of beer per week     Comment: social    Family History  Problem Relation Age of Onset  . Hypertension Mother   . Stroke Mother     No Known Allergies  Medication list has been reviewed and updated.  Current Outpatient Prescriptions on File Prior to Visit  Medication Sig Dispense Refill  . HYDROcodone-homatropine (HYCODAN) 5-1.5 MG/5ML syrup Take 5 mLs by mouth every 6 (six) hours as needed for cough.  120 mL  0  . oseltamivir (TAMIFLU) 75 MG capsule Take 1 capsule (75 mg total) by mouth 2 (two) times daily.  10 capsule  0  . traMADol (ULTRAM) 50 MG tablet Take 1 tablet (50 mg total) by mouth daily.  30 tablet  1   Current Facility-Administered Medications on File Prior to Visit  Medication Dose Route Frequency Provider Last Rate Last Dose  . ondansetron (ZOFRAN) 4 mg in sodium chloride 0.9 % 50 mL IVPB  4 mg Intravenous Once Elvina SidleKurt Lauenstein, MD        Review of Systems:  As per HPI- otherwise negative. ROS sheet is pan-  negative  Physical Examination: Filed Vitals:   04/27/14 1258  BP: 118/78  Pulse: 76  Temp: 97.4 F (36.3 C)  Resp: 18   Filed Vitals:   04/27/14 1258  Height: 5' 1.75" (1.568 m)  Weight: 139 lb (63.05 kg)   Body mass index is 25.64 kg/(m^2). Ideal Body Weight: Weight in (lb) to have BMI = 25: 135.3  GEN: WDWN, NAD, Non-toxic, A & O x 3 HEENT: Atraumatic, Normocephalic. Neck supple. No masses, No LAD. Ears and Nose: No external deformity. CV: RRR, No M/G/R. No JVD. No thrill. No extra heart sounds. PULM: CTA B, no wheezes, crackles, rhonchi. No retractions. No resp. distress. No accessory muscle use. ABD: S, NT, ND, +BS. No rebound. No HSM. EXTR: No c/c/e NEURO Normal gait.  PSYCH: Normally interactive. Conversant. Not depressed or anxious appearing.  Calm demeanor.  Gu: normal exam  Assessment and Plan: Physical exam - Plan: CBC, Comprehensive metabolic panel, Lipid panel  Healthy man here for periodic PE.  Await labs.    Signed Abbe AmsterdamJessica Ellery Meroney, MD

## 2014-04-27 NOTE — Patient Instructions (Signed)
Good to see you today- I will be in touch with your labs asap  You can by imaging on the way out and schedule your mammogram and coronary calcium  Take care!  Congrats on your marathon!!   

## 2014-04-28 ENCOUNTER — Encounter: Payer: Self-pay | Admitting: Family Medicine

## 2014-04-29 LAB — HEPATITIS PANEL, ACUTE
HCV AB: NEGATIVE
Hep A IgM: NONREACTIVE
Hep B C IgM: NONREACTIVE
Hepatitis B Surface Ag: POSITIVE — AB

## 2014-05-04 ENCOUNTER — Encounter: Payer: Self-pay | Admitting: Family Medicine

## 2014-05-11 ENCOUNTER — Telehealth: Payer: Self-pay | Admitting: Family Medicine

## 2014-05-11 DIAGNOSIS — B181 Chronic viral hepatitis B without delta-agent: Secondary | ICD-10-CM

## 2014-05-11 NOTE — Telephone Encounter (Signed)
His son Joelyn OmsChau called me- will refer to ID to further evaluate for Hep B.

## 2014-06-11 ENCOUNTER — Ambulatory Visit (INDEPENDENT_AMBULATORY_CARE_PROVIDER_SITE_OTHER): Payer: PRIVATE HEALTH INSURANCE | Admitting: Internal Medicine

## 2014-06-11 VITALS — BP 131/86 | HR 76 | Temp 97.8°F | Wt 140.0 lb

## 2014-06-11 DIAGNOSIS — B1910 Unspecified viral hepatitis B without hepatic coma: Secondary | ICD-10-CM

## 2014-06-11 LAB — HEPATITIS A ANTIBODY, TOTAL: Hep A Total Ab: REACTIVE — AB

## 2014-06-11 LAB — HIV ANTIBODY (ROUTINE TESTING W REFLEX): HIV 1&2 Ab, 4th Generation: NONREACTIVE

## 2014-06-11 LAB — HEPATITIS B SURFACE ANTIBODY,QUALITATIVE: Hep B S Ab: NEGATIVE

## 2014-06-11 LAB — HEPATITIS B CORE ANTIBODY, TOTAL: HEP B C TOTAL AB: REACTIVE — AB

## 2014-06-11 NOTE — Progress Notes (Signed)
Patient ID: Brian Preston, male   DOB: 03/11/1971, 43 y.o.   MRN: [id number] +Brian Preston is a 43 y.o. male who presents for evaluation and management of chronic hepatitis B . Hepatitis B risk factors present are: likely vertical transmission vs. Sexually acquired in endemic country Patient denies any IVDU. Patient has not had other studies performed.  Patient has not had prior treatment for Hepatitis B. Patient does not have a past history of liver disease. Patient does not have a family history of liver disease.   Unclear if he has have documented immunity to Hepatitis A.  No Known Allergies No current outpatient prescriptions on file prior to visit.   No current facility-administered medications on file prior to visit.    Review of Systems Review of Systems  Constitutional: Negative for fever, chills, diaphoresis, activity change, appetite change, fatigue and unexpected weight change.  HENT: Negative for congestion, sore throat, rhinorrhea, sneezing, trouble swallowing and sinus pressure.  Eyes: Negative for photophobia and visual disturbance.  Respiratory: Negative for cough, chest tightness, shortness of breath, wheezing and stridor.  Cardiovascular: Negative for chest pain, palpitations and leg swelling.  Gastrointestinal: Negative for nausea, vomiting, abdominal pain, diarrhea, constipation, blood in stool, abdominal distention and anal bleeding.  Genitourinary: Negative for dysuria, hematuria, flank pain and difficulty urinating.  Musculoskeletal: Negative for myalgias, back pain, joint swelling, arthralgias and gait problem.  Skin: Negative for color change, pallor, rash and wound.  Neurological: Negative for dizziness, tremors, weakness and light-headedness.  Hematological: Negative for adenopathy. Does not bruise/bleed easily.  Psychiatric/Behavioral: Negative for behavioral problems, confusion, sleep disturbance, dysphoric mood, decreased concentration and agitation.     No past  medical history on file.  History  Substance Use Topics  . Smoking status: Former Smoker    Quit date: 11/22/1992  . Smokeless tobacco: Not on file  . Alcohol Use: 0.6 oz/week    1 Cans of beer per week     Comment: social  - [employer], works full time.   Family History  Problem Relation Age of Onset  . Hypertension Mother   . Stroke Mother       Objective:   Filed Vitals:   06/11/14 1122  BP: 131/86  Pulse: 76  Temp: 97.8 F (36.6 C)   Physical Exam  Constitutional: He is oriented to person, place, and time. He appears well-developed and well-nourished. No distress.  HENT:  Mouth/Throat: Oropharynx is clear and moist. No oropharyngeal exudate.  Cardiovascular: Normal rate, regular rhythm and normal heart sounds. Exam reveals no gallop and no friction rub.  No murmur heard.  Pulmonary/Chest: Effort normal and breath sounds normal. No respiratory distress. He has no wheezes.  Abdominal: Soft. Bowel sounds are normal. He exhibits no distension. There is no tenderness.  Lymphadenopathy:  He has no cervical adenopathy.  Neurological: He is alert and oriented to person, place, and time.  Skin: Skin is warm and dry. No rash noted. No erythema.  Psychiatric: He has a normal mood and affect. His behavior is normal.    Laboratory  Lab Results  Component Value Date   WBC 8.6 04/27/2014   HGB 15.1 04/27/2014   HCT 43.9 04/27/2014   MCV 84.3 04/27/2014   PLT 295 04/27/2014    Lab Results  Component Value Date   CREATININE 0.82 04/27/2014   BUN 13 04/27/2014   NA 141 04/27/2014   K 5.4* 04/27/2014   CL 105 04/27/2014   CO2 28 04/27/2014    Lab Results  Component Value Date   ALT 92* 04/27/2014   AST 43* 04/27/2014   ALKPHOS 52 04/27/2014   BILITOT 0.3 04/27/2014   Lab Results  Component Value Date   HEPAIGM NON REACTIVE 04/27/2014   Lab Results  Component Value Date   HEPBSAB NEG 06/11/2014   Lab Results  Component Value Date   HCVAB NEGATIVE 04/27/2014   Lab  Results  Component Value Date   HEPBSAG POSITIVE* 04/27/2014   Hepatic Function Latest Ref Rng 04/27/2014 03/03/2013 04/17/2012  Total Protein 6.0 - 8.3 g/dL 6.8 7.1 5.5(L)  Albumin 3.5 - 5.2 g/dL 4.0 4.0 3.1(L)  AST 0 - 37 U/L 43(H) 29 17  ALT 0 - 53 U/L 92(H) 60(H) 38  Alk Phosphatase 39 - 117 U/L 52 57 43  Total Bilirubin 0.2 - 1.2 mg/dL 0.3 0.4 0.7     Radiology No components found with this basename: ULTRASOUNDABDOMEN, ULTRASOUNDHEPATICELASTOGRAPHY    Assessment: Chronic hepatitis B  Plan: 1) Patient counseled extensively on limiting acetaminophen to no more than 2 grams daily, avoidance of alcohol. 2) Transmission discussed with patient including sexual transmission, sharing razors and toothbrush.   3) Will need referral to gastroenterology: no 4) Will need referral for substance abuse counseling: no 5) will check Hepatitis A ab to see if he needs vaccination. Also check Hepatitis B viral load, hep B e antibody, Hep B e antigen, and HIV. Pending these results, will help determine need for therapy 6) will order RUQ u/s 7) recommend for wife to be tested or have hep b vaccine

## 2014-06-15 LAB — HEPATITIS B E ANTIGEN: HEPATITIS BE ANTIGEN: NONREACTIVE

## 2014-06-15 LAB — HEPATITIS B E ANTIBODY: HEPATITIS BE ANTIBODY: REACTIVE — AB

## 2014-06-16 LAB — HEPATITIS B DNA, ULTRAQUANTITATIVE, PCR
Hepatitis B DNA (Calc): 4153280 copies/mL — ABNORMAL HIGH (ref <116)
Hepatitis B DNA: 713622 IU/mL — ABNORMAL HIGH (ref <20)

## 2014-12-09 ENCOUNTER — Ambulatory Visit (INDEPENDENT_AMBULATORY_CARE_PROVIDER_SITE_OTHER): Payer: PRIVATE HEALTH INSURANCE | Admitting: Urgent Care

## 2014-12-09 VITALS — BP 122/74 | HR 105 | Temp 98.4°F | Resp 17 | Ht 62.5 in | Wt 140.0 lb

## 2014-12-09 DIAGNOSIS — J988 Other specified respiratory disorders: Secondary | ICD-10-CM

## 2014-12-09 DIAGNOSIS — R059 Cough, unspecified: Secondary | ICD-10-CM

## 2014-12-09 DIAGNOSIS — R05 Cough: Secondary | ICD-10-CM

## 2014-12-09 MED ORDER — HYDROCODONE-HOMATROPINE 5-1.5 MG/5ML PO SYRP: 5.0000 mL | ORAL_SOLUTION | Freq: Every evening | ORAL | Status: DC | PRN

## 2014-12-09 MED ORDER — BENZONATATE 100 MG PO CAPS: 100.0000 mg | ORAL_CAPSULE | Freq: Three times a day (TID) | ORAL | Status: DC | PRN

## 2014-12-09 NOTE — Progress Notes (Signed)
    MRN: [id number] DOB: [birth year]  Subjective:   Brian Preston is a [age] y.o. male presenting for 2 day history of cough lightly productive with yellowish sputum worse at night.Also has sinus headache, sinus congestion, rhinorrhea, some shortness of breath and wheezing at night with cough, chills, subjective fever. Has tried DayQuil with minimal relief. No sick contacts. Denies history of seasonal allergies or asthma. Takes flu shot every year including this year, however reports that he has still gotten the flu frequently, at least every year. Denies smoking or alcohol use. Denies any other aggravating or relieving factors, no other questions or concerns.  Brian Preston currently has no medications in their medication list.  He has No Known Allergies.  Brian Preston  has no past medical history on file. Also  has no past surgical history on file.  ROS As in subjective.  Objective:   Vitals: BP 122/74 mmHg  Pulse 105  Temp(Src) 98.4 F (36.9 C) (Oral)  Resp 17  Ht 5' 2.5" (1.588 m)  Wt 140 lb (63.504 kg)  BMI 25.18 kg/m2  SpO2 98%  Physical Exam  Constitutional: He is oriented to person, place, and time and well-developed, well-nourished, and in no distress.  HENT:  TM's intact bilaterally, no effusions or erythema. Nasal turbinates mildly inflamed but not edematous, clear-yellow rhinorrhea. No sinus tenderness. Postnasal drip present, without oropharyngeal exudates or tonsillar edema. Mucous membranes moist.  Eyes: Conjunctivae are normal. Right eye exhibits no discharge. Left eye exhibits no discharge. No scleral icterus.  Neck: Normal range of motion.  Cardiovascular: Normal rate, regular rhythm, normal heart sounds and intact distal pulses.  Exam reveals no gallop and no friction rub.   No murmur heard. HR 92 on recheck.  Pulmonary/Chest: Effort normal and breath sounds normal. No stridor. No respiratory distress. He has no wheezes. He has no rales. He exhibits no tenderness.  Lymphadenopathy:      He has no cervical adenopathy.  Neurological: He is alert and oriented to person, place, and time.  Skin: Skin is warm and dry. No rash noted. He is not diaphoretic. No erythema.  Psychiatric: Mood and affect normal.   Assessment and Plan :   1. Cough 2. Respiratory infection - Likely viral syndrome, advised supportive care, patient requested note for work, plans on taking 2 sick days. - Hycodan and Tessalon for cough - If no improvement or worsening symptoms, return to clinic in 1 week   Wallis BambergMario Teaghan Formica, PA-C [facility] [phone] 12/09/2014 12:30 PM

## 2014-12-09 NOTE — Patient Instructions (Signed)
Upper Respiratory Infection, Adult An upper respiratory infection (URI) is also sometimes known as the common cold. The upper respiratory tract includes the nose, sinuses, throat, trachea, and bronchi. Bronchi are the airways leading to the lungs. Most people improve within 1 week, but symptoms can last up to 2 weeks. A residual cough may last even longer.  CAUSES Many different viruses can infect the tissues lining the upper respiratory tract. The tissues become irritated and inflamed and often become very moist. Mucus production is also common. A cold is contagious. You can easily spread the virus to others by oral contact. This includes kissing, sharing a glass, coughing, or sneezing. Touching your mouth or nose and then touching a surface, which is then touched by another person, can also spread the virus. SYMPTOMS  Symptoms typically develop 1 to 3 days after you come in contact with a cold virus. Symptoms vary from person to person. They may include:  Runny nose.  Sneezing.  Nasal congestion.  Sinus irritation.  Sore throat.  Loss of voice (laryngitis).  Cough.  Fatigue.  Muscle aches.  Loss of appetite.  Headache.  Low-grade fever. DIAGNOSIS  You might diagnose your own cold based on familiar symptoms, since most people get a cold 2 to 3 times a year. Your caregiver can confirm this based on your exam. Most importantly, your caregiver can check that your symptoms are not due to another disease such as strep throat, sinusitis, pneumonia, asthma, or epiglottitis. Blood tests, throat tests, and X-rays are not necessary to diagnose a common cold, but they may sometimes be helpful in excluding other more serious diseases. Your caregiver will decide if any further tests are required. RISKS AND COMPLICATIONS  You may be at risk for a more severe case of the common cold if you smoke cigarettes, have chronic heart disease (such as heart failure) or lung disease (such as asthma), or if  you have a weakened immune system. The very young and very old are also at risk for more serious infections. Bacterial sinusitis, middle ear infections, and bacterial pneumonia can complicate the common cold. The common cold can worsen asthma and chronic obstructive pulmonary disease (COPD). Sometimes, these complications can require emergency medical care and may be life-threatening. PREVENTION  The best way to protect against getting a cold is to practice good hygiene. Avoid oral or hand contact with people with cold symptoms. Wash your hands often if contact occurs. There is no clear evidence that vitamin C, vitamin E, echinacea, or exercise reduces the chance of developing a cold. However, it is always recommended to get plenty of rest and practice good nutrition. TREATMENT  Treatment is directed at relieving symptoms. There is no cure. Antibiotics are not effective, because the infection is caused by a virus, not by bacteria. Treatment may include:  Increased fluid intake. Sports drinks offer valuable electrolytes, sugars, and fluids.  Breathing heated mist or steam (vaporizer or shower).  Eating chicken soup or other clear broths, and maintaining good nutrition.  Getting plenty of rest.  Using gargles or lozenges for comfort.  Controlling fevers with ibuprofen or acetaminophen as directed by your caregiver.  Increasing usage of your inhaler if you have asthma. Zinc gel and zinc lozenges, taken in the first 24 hours of the common cold, can shorten the duration and lessen the severity of symptoms. Pain medicines may help with fever, muscle aches, and throat pain. A variety of non-prescription medicines are available to treat congestion and runny nose. Your caregiver   can make recommendations and may suggest nasal or lung inhalers for other symptoms.  HOME CARE INSTRUCTIONS   Only take over-the-counter or prescription medicines for pain, discomfort, or fever as directed by your  caregiver.  Use a warm mist humidifier or inhale steam from a shower to increase air moisture. This may keep secretions moist and make it easier to breathe.  Drink enough water and fluids to keep your urine clear or pale yellow.  Rest as needed.  Return to work when your temperature has returned to normal or as your caregiver advises. You may need to stay home longer to avoid infecting others. You can also use a face mask and careful hand washing to prevent spread of the virus. SEEK MEDICAL CARE IF:   After the first few days, you feel you are getting worse rather than better.  You need your caregiver's advice about medicines to control symptoms.  You develop chills, worsening shortness of breath, or brown or red sputum. These may be signs of pneumonia.  You develop yellow or brown nasal discharge or pain in the face, especially when you bend forward. These may be signs of sinusitis.  You develop a fever, swollen neck glands, pain with swallowing, or white areas in the back of your throat. These may be signs of strep throat. SEEK IMMEDIATE MEDICAL CARE IF:   You have a fever.  You develop severe or persistent headache, ear pain, sinus pain, or chest pain.  You develop wheezing, a prolonged cough, cough up blood, or have a change in your usual mucus (if you have chronic lung disease).  You develop sore muscles or a stiff neck. Document Released: 04/25/2001 Document Revised: 01/22/2012 Document Reviewed: 02/04/2014 [employer] Patient Information 2015 [employer]. This information is not intended to replace advice given to you by your health care provider. Make sure you discuss any questions you have with your health care provider.  

## 2015-01-22 ENCOUNTER — Ambulatory Visit (INDEPENDENT_AMBULATORY_CARE_PROVIDER_SITE_OTHER): Payer: PRIVATE HEALTH INSURANCE | Admitting: Family Medicine

## 2015-01-22 VITALS — BP 140/92 | HR 117 | Temp 98.9°F | Resp 16 | Ht 61.25 in | Wt 138.8 lb

## 2015-01-22 DIAGNOSIS — B349 Viral infection, unspecified: Secondary | ICD-10-CM | POA: Diagnosis not present

## 2015-01-22 DIAGNOSIS — R05 Cough: Secondary | ICD-10-CM | POA: Diagnosis not present

## 2015-01-22 DIAGNOSIS — R1111 Vomiting without nausea: Secondary | ICD-10-CM | POA: Diagnosis not present

## 2015-01-22 DIAGNOSIS — R059 Cough, unspecified: Secondary | ICD-10-CM

## 2015-01-22 LAB — POCT INFLUENZA A/B
Influenza A, POC: NEGATIVE
Influenza B, POC: NEGATIVE

## 2015-01-22 MED ORDER — HYDROCODONE-HOMATROPINE 5-1.5 MG/5ML PO SYRP: 5.0000 mL | ORAL_SOLUTION | Freq: Three times a day (TID) | ORAL | Status: DC | PRN

## 2015-01-22 MED ORDER — OSELTAMIVIR PHOSPHATE 75 MG PO CAPS: 75.0000 mg | ORAL_CAPSULE | Freq: Two times a day (BID) | ORAL | Status: DC

## 2015-01-22 NOTE — Progress Notes (Signed)
° °  Subjective:    Patient ID: Brian Preston, male    DOB: [birth year], 44 y.o.   MRN: [id number]  This chart was scribed for Elvina SidleKurt Lauenstein, MD, by Ronney LionSuzanne Le, ED Scribe. This patient was seen in room 11 and the patient's care was started at 12:48 PM.   Chief Complaint  Patient presents with   Cough    Onset 3 days   Generalized Body Aches   Emesis    3 days   Headache   Chills   Cough Associated symptoms include ear pain, headaches and myalgias. Pertinent negatives include no sore throat.  Emesis  Associated symptoms include coughing, headaches and myalgias. Pertinent negatives include no abdominal pain or diarrhea.  Headache  Associated symptoms include coughing, ear pain, nausea and vomiting. Pertinent negatives include no abdominal pain or sore throat.    HPI Comments: Brian Preston is a 44 y.o. male who presents to the [facility] complaining of flu-like symptoms. He complains of associated headache with otalgia, nausea and vomiting after eating that has been ongoing for 3 days, and generalized myalgias that he describes as feeling deep in his bones. He states he hasn't been eating due to the vomiting. Patient was taking leftover Tamiflu that he was given last year, with an expiry date in 2011. He denies sore throat, abdominal pain, or diarrhea.   Review of Systems  HENT: Positive for ear pain. Negative for sore throat.   Respiratory: Positive for cough.   Gastrointestinal: Positive for nausea and vomiting. Negative for abdominal pain and diarrhea.  Musculoskeletal: Positive for myalgias.  Neurological: Positive for headaches.       Objective:   Physical Exam  Constitutional: He is oriented to person, place, and time. He appears well-developed and well-nourished. No distress.  HENT:  Head: Normocephalic and atraumatic.  Chronic scarring and deformities in both TMs with dullness.   Eyes: Conjunctivae and EOM are normal.  Neck: Neck supple. No tracheal  deviation present.  Cardiovascular: Normal rate and regular rhythm.  Exam reveals no gallop.   No murmur heard. Pulmonary/Chest: Effort normal and breath sounds normal. No respiratory distress. He has no wheezes. He has no rales.  Musculoskeletal: Normal range of motion.  Lymphadenopathy:    He has no cervical adenopathy.  Neurological: He is alert and oriented to person, place, and time.  Skin: Skin is warm and dry.  Psychiatric: He has a normal mood and affect. His behavior is normal.  Nursing note and vitals reviewed.  Results for orders placed or performed in visit on 01/22/15  POCT Influenza A/B  Result Value Ref Range   Influenza A, POC Negative    Influenza B, POC Negative       Assessment & Plan:  . This chart was scribed in my presence and reviewed by me personally.    ICD-9-CM ICD-10-CM   1. Vomiting without nausea, vomiting of unspecified type 787.03 R11.11 POCT Influenza A/B     HYDROcodone-homatropine (HYCODAN) 5-1.5 MG/5ML syrup     oseltamivir (TAMIFLU) 75 MG capsule  2. Cough 786.2 R05 POCT Influenza A/B     HYDROcodone-homatropine (HYCODAN) 5-1.5 MG/5ML syrup     oseltamivir (TAMIFLU) 75 MG capsule  3. Viral illness 079.99 B34.9 HYDROcodone-homatropine (HYCODAN) 5-1.5 MG/5ML syrup     oseltamivir (TAMIFLU) 75 MG capsule     Signed, Elvina SidleKurt Lauenstein, MD

## 2015-01-22 NOTE — Patient Instructions (Signed)
Cm (Influenza) Cm ("b?nh cm") l tnh tr?ng nhi?m trng ???ng h h?p do vi rt. Cm x?y ra th??ng xuyn h?n vo nh?ng thng ma ?ng v m?i ng??i dnh nhi?u th?i gian ti?p xc g?n g?i v?i nhau h?n. Cm c th? lm cho qu v? c?m th?y r?t m?t m?i. Cm d? dng ly t? ng??i sang ng??i (d? ly). NGUYN NHN  Cm do m?t lo?i vi rt lm nhi?m trng ???ng h h?p gy ra. Qu v? c th? b? nhi?m vi rt do ht ph?i gi?t n??c b?n ra khi ng??i b? nhi?m b?nh ho ho?c h?t h?i. Qu v? c?ng c th? b? nhi?m vi rt do ch?m vo nh?ng v?t ? b? nhi?m vi rt g?n ?y, sau ? ch?m vo mi?ng, m?i ho?c m?t mnh. NGUY C? V BI?N CH?NG Qu v? c th? c nguy c? b? m?t tr??ng h?p cm n?ng h?n n?u qu v? ht thu?c l, b? ti?u ???ng, b? b?nh tim m?n tnh (nh? suy tim), ho?c b?nh ph?i (nh? hen suy?n), ho?c n?u qu v? c h? mi?n d?ch suy y?u. Ng??i cao tu?i v ph? n? mang thai c?ng c nguy c? b? nhi?m trng nghim tr?ng h?n. V?n ?? th??ng g?p nh?t c?a b?nh cm l nhi?m trng ph?i (vim ph?i). ?i khi, v?n ?? ny c th? c?n ph?i ???c ch?m [place] ?i?u tr? c?p c?u v c th? ?e d?a tnh m?ng. D?U HI?U V TRI?U CH?NG  Cc tri?u ch?ng th??ng ko di 4 ??n 10 ngy v c th? bao g?m:  S?t.  ?n l?nh.  ?au ??u, ?au nh?c c? th? v ?au nh?c c? b?p.  ?au h?ng.  C?m gic kh ch?u ? ng?c v ho.  ?n khng ngon.  Y?u ho?c c?m gic m?t m?i.  Chng m?t.  Bu?n nn ho?c nn m?a. CH?N ?ON  Ch?n ?on cm th??ng ???c ??a ra d?a vo b?nh s? v khm th?c th?. Xt nghi?m t?m bng ngoy m?i ho?c ngoy h?ng c th? ???c th?c hi?n ?? xc ??nh ch?n ?on. ?I?U TR?  Trong tr??ng h?p nh?, b?nh cm s? t? kh?i. ?i?u tr? nh?m lm gi?m tri?u ch?ng. ??i v?i nh?ng tr??ng h?p n?ng h?n, chuyn gia ch?m [place] s?c kh?e c th? k ??n thu?c khng vi rt ?? nhanh kh?i b?nh. Thu?c khng sinh khng hi?u qu? v nhi?m trng do vi rt gy ra, ch? khng ph?i do vi khu?n. H??NG D?N CH?M [place] T?I NH  Ch? s? d?ng thu?c theo ch? d?n c?a chuyn gia ch?m [place] s?c kh?e.  S?  d?ng d?ng c? lm ?m khng kh t?o s??ng m mt ?? gip d? th? h?n.  Ngh? ng?i th?t nhi?u cho ??n khi nhi?t ?? c?a qu v? tr? l?i bnh th??ng. ?i?u ny th??ng m?t 3 ??n 4 ngy.  U?ng ?? n??c ?? gi? cho n??c ti?u trong ho?c vng nh?t.  Che mi?ng v m?i khi ho ho?c h?t h?i, ??ng th?i r?a tay k? ?? trnh ly lan vi rt.  Ngh? lm ho?c ngh? h?c cho ??n sau khi h?t s?t t nh?t 1 ngy. PHNG NG?A  Tim phng cm hng n?m (tim phng cm) l cch t?t nh?t ?? trnh b? cm. Tim phng ccm hng n?m hi?n nay th??ng xuyn ???c khuy?n ngh? cho t?t c? ng??i l?n ? [place] ?I KHM N?U:  Qu v? b? ?au ng?c, ho n?ng thm, ho?c ho ra nhi?u d?ch nh?y h?n.  Qu v? b? bu?n nn, nn m?a ho?c tiu ch?y.  Qu v? b?  s?t tr? l?i ho?c s?t cao h?n. NGAY L?P T?C ?I KHM N?U:  Qu v? th? kh kh?n, kh th?, ho?c da hay mng tay mng chn qu v? tr? nn xanh.  Qu v? b? ?au c? ho?c c?ng c? d? d?i.  Qu vi ??t nhin b? ?au ??u, ho?c ?au ? m?t ho?c tai.  Qu vi b? bu?n nn ho?c nn m?a khng th? ki?m sot ???c. ??M B?O QU V?:   Hi?u r cc h??ng d?n ny.  S? theo di tnh tr?ng c?a mnh.  S? yu c?u tr? gip ngay l?p t?c n?u qu v? c?m th?y khng kh?e ho?c th?y tr?m tr?ng h?n. Document Released: 10/30/2005 Document Revised: 03/16/2014 [facility] Patient Information 2015 [place], [place]. This information is not intended to replace advice given to you by your health care provider. Make sure you discuss any questions you have with your health care provider.

## 2015-11-25 ENCOUNTER — Ambulatory Visit (INDEPENDENT_AMBULATORY_CARE_PROVIDER_SITE_OTHER): Payer: BLUE CROSS/BLUE SHIELD | Admitting: Urgent Care

## 2015-11-25 ENCOUNTER — Encounter: Payer: Self-pay | Admitting: Urgent Care

## 2015-11-25 VITALS — BP 117/74 | HR 84 | Temp 98.2°F | Resp 16 | Ht 62.25 in | Wt 134.0 lb

## 2015-11-25 DIAGNOSIS — G4700 Insomnia, unspecified: Secondary | ICD-10-CM | POA: Diagnosis not present

## 2015-11-25 DIAGNOSIS — H9311 Tinnitus, right ear: Secondary | ICD-10-CM | POA: Diagnosis not present

## 2015-11-25 DIAGNOSIS — Z23 Encounter for immunization: Secondary | ICD-10-CM

## 2015-11-25 DIAGNOSIS — Z0000 Encounter for general adult medical examination without abnormal findings: Secondary | ICD-10-CM

## 2015-11-25 DIAGNOSIS — Z8619 Personal history of other infectious and parasitic diseases: Secondary | ICD-10-CM | POA: Diagnosis not present

## 2015-11-25 LAB — COMPREHENSIVE METABOLIC PANEL
ALT: 99 U/L — ABNORMAL HIGH (ref 9–46)
AST: 40 U/L (ref 10–40)
Albumin: 4 g/dL (ref 3.6–5.1)
Alkaline Phosphatase: 64 U/L (ref 40–115)
BILIRUBIN TOTAL: 0.4 mg/dL (ref 0.2–1.2)
BUN: 20 mg/dL (ref 7–25)
CO2: 28 mmol/L (ref 20–31)
Calcium: 9.6 mg/dL (ref 8.6–10.3)
Chloride: 99 mmol/L (ref 98–110)
Creat: 0.91 mg/dL (ref 0.60–1.35)
GLUCOSE: 58 mg/dL — AB (ref 65–99)
POTASSIUM: 4 mmol/L (ref 3.5–5.3)
Sodium: 134 mmol/L — ABNORMAL LOW (ref 135–146)
Total Protein: 7.1 g/dL (ref 6.1–8.1)

## 2015-11-25 LAB — LIPID PANEL
CHOL/HDL RATIO: 4.7 ratio (ref <=5.0)
Cholesterol: 187 mg/dL (ref 125–200)
HDL: 40 mg/dL (ref >=40)
LDL CALC: 119 mg/dL (ref <130)
Triglycerides: 140 mg/dL (ref <150)
VLDL: 28 mg/dL (ref <30)

## 2015-11-25 LAB — CBC
HCT: 47.7 % (ref 39.0–52.0)
HEMOGLOBIN: 15.8 g/dL (ref 13.0–17.0)
MCH: 28.9 pg (ref 26.0–34.0)
MCHC: 33.1 g/dL (ref 30.0–36.0)
MCV: 87.2 fL (ref 78.0–100.0)
MPV: 10 fL (ref 8.6–12.4)
Platelets: 290 10*3/uL (ref 150–400)
RBC: 5.47 MIL/uL (ref 4.22–5.81)
RDW: 14.3 % (ref 11.5–15.5)
WBC: 10 10*3/uL (ref 4.0–10.5)

## 2015-11-25 MED ORDER — CYCLOBENZAPRINE HCL 10 MG PO TABS: 5.0000 mg | ORAL_TABLET | Freq: Every day | ORAL | Status: DC

## 2015-11-25 MED ORDER — TYPHOID VACCINE PO CPDR: 1.0000 | DELAYED_RELEASE_CAPSULE | ORAL | Status: DC

## 2015-11-25 NOTE — Patient Instructions (Addendum)
Keeping you healthy  Get these tests  Blood pressure- Have your blood pressure checked once a year by your healthcare provider.  Normal blood pressure is 120/80.  Weight- Have your body mass index (BMI) calculated to screen for obesity.  BMI is a measure of body fat based on height and weight. You can also calculate your own BMI at [employer].it.  Cholesterol- Have your cholesterol checked regularly starting at age 44, sooner may be necessary if you have diabetes, high blood pressure, if a family member developed heart diseases at an early age or if you smoke.   Chlamydia, HIV, and other sexual transmitted disease- Get screened each year until the age of 65 then within three months of each new sexual partner.  Diabetes- Have your blood sugar checked regularly if you have high blood pressure, high cholesterol, a family history of diabetes or if you are overweight.  Get these vaccines  Flu shot- Every fall.  Tetanus shot- Every 10 years.  Menactra- Single dose; prevents meningitis.  Take these steps  Don't smoke- If you do smoke, ask your healthcare provider about quitting. For tips on how to quit, go to www.smokefree.gov or call 1-800-QUIT-NOW.  Be physically active- Exercise 5 days a week for at least 30 minutes.  If you are not already physically active start slow and gradually work up to 30 minutes of moderate physical activity.  Examples of moderate activity include walking briskly, mowing the yard, dancing, swimming bicycling, etc.  Eat a healthy diet- Eat a variety of healthy foods such as fruits, vegetables, low fat milk, low fat cheese, yogurt, lean meats, poultry, fish, beans, tofu, etc.  For more information on healthy eating, go to www.thenutritionsource.org  Drink alcohol in moderation- Limit alcohol intake two drinks or less a day.  Never drink and drive.  Dentist- Brush and floss teeth twice daily; visit your dentis twice a year.  Depression-Your emotional  health is as important as your physical health.  If you're feeling down, losing interest in things you normally enjoy please talk with your healthcare provider.  Gun Safety- If you keep a gun in your home, keep it unloaded and with the safety lock on.  Bullets should be stored separately.  Helmet use- Always wear a helmet when riding a motorcycle, bicycle, rollerblading or skateboarding.  Safe sex- If you may be exposed to a sexually transmitted infection, use a condom  Seat belts- Seat bels can save your life; always wear one.  Smoke/Carbon Monoxide detectors- These detectors need to be installed on the appropriate level of your home.  Replace batteries at least once a year.  Skin Cancer- When out in the sun, cover up and use sunscreen SPF 15 or higher.  Violence- If anyone is threatening or hurting you, please tell your healthcare provider.    Insomnia Insomnia is a sleep disorder that makes it difficult to fall asleep or to stay asleep. Insomnia can cause tiredness (fatigue), low energy, difficulty concentrating, mood swings, and poor performance at work or school.  There are three different ways to classify insomnia:  Difficulty falling asleep.  Difficulty staying asleep.  Waking up too early in the morning. Any type of insomnia can be long-term (chronic) or short-term (acute). Both are common. Short-term insomnia usually lasts for three months or less. Chronic insomnia occurs at least three times a week for longer than three months. CAUSES  Insomnia may be caused by another condition, situation, or substance, such as:  Anxiety.  Certain medicines.  Gastroesophageal reflux disease (GERD) or other gastrointestinal conditions.  Asthma or other breathing conditions.  Restless legs syndrome, sleep apnea, or other sleep disorders.  Chronic pain.  Menopause. This may include hot flashes.  Stroke.  Abuse of alcohol, tobacco, or illegal drugs.  Depression.  Caffeine.    Neurological disorders, such as Alzheimer disease.  An overactive thyroid (hyperthyroidism). The cause of insomnia may not be known. RISK FACTORS Risk factors for insomnia include:  Gender. Women are more commonly affected than men.  Age. Insomnia is more common as you get older.  Stress. This may involve your professional or personal life.  Income. Insomnia is more common in people with lower income.  Lack of exercise.   Irregular work schedule or night shifts.  Traveling between different time zones. SIGNS AND SYMPTOMS If you have insomnia, trouble falling asleep or trouble staying asleep is the main symptom. This may lead to other symptoms, such as:  Feeling fatigued.  Feeling nervous about going to sleep.  Not feeling rested in the morning.  Having trouble concentrating.  Feeling irritable, anxious, or depressed. TREATMENT  Treatment for insomnia depends on the cause. If your insomnia is caused by an underlying condition, treatment will focus on addressing the condition. Treatment may also include:   Medicines to help you sleep.  Counseling or therapy.  Lifestyle adjustments. HOME CARE INSTRUCTIONS   Take medicines only as directed by your health care provider.  Keep regular sleeping and waking hours. Avoid naps.  Keep a sleep diary to help you and your health care provider figure out what could be causing your insomnia. Include:   When you sleep.  When you wake up during the night.  How well you sleep.   How rested you feel the next day.  Any side effects of medicines you are taking.  What you eat and drink.   Make your bedroom a comfortable place where it is easy to fall asleep:  Put up shades or special blackout curtains to block light from outside.  Use a white noise machine to block noise.  Keep the temperature cool.   Exercise regularly as directed by your health care provider. Avoid exercising right before bedtime.  Use  relaxation techniques to manage stress. Ask your health care provider to suggest some techniques that may work well for you. These may include:  Breathing exercises.  Routines to release muscle tension.  Visualizing peaceful scenes.  Cut back on alcohol, caffeinated beverages, and cigarettes, especially close to bedtime. These can disrupt your sleep.  Do not overeat or eat spicy foods right before bedtime. This can lead to digestive discomfort that can make it hard for you to sleep.  Limit screen use before bedtime. This includes:  Watching TV.  Using your smartphone, tablet, and computer.  Stick to a routine. This can help you fall asleep faster. Try to do a quiet activity, brush your teeth, and go to bed at the same time each night.  Get out of bed if you are still awake after 15 minutes of trying to sleep. Keep the lights down, but try reading or doing a quiet activity. When you feel sleepy, go back to bed.  Make sure that you drive carefully. Avoid driving if you feel very sleepy.  Keep all follow-up appointments as directed by your health care provider. This is important. SEEK MEDICAL CARE IF:   You are tired throughout the day or have trouble in your daily routine due to sleepiness.  You continue  to have sleep problems or your sleep problems get worse. SEEK IMMEDIATE MEDICAL CARE IF:   You have serious thoughts about hurting yourself or someone else.   This information is not intended to replace advice given to you by your health care provider. Make sure you discuss any questions you have with your health care provider.   Document Released: 10/27/2000 Document Revised: 07/21/2015 Document Reviewed: 07/31/2014 Elsevier Interactive Patient Education 2016 [place] do kh p ([employer]) Vim tai gi?a kh p l b?nh vim tai gi?a. B?nh ny x?y ra khi ?ng thnh gic (vi nh?) d?n t? thnh sau m?i (m?i h?u) ??n mng nh? c?a qu v? b? t?c. Ch? t?c  ny c th? do c?m l?nh, d? ?ng v?i mi tr??ng, ho?c m?t nhi?m trng ???ng h h?p trn. Vim tai gi?a do kh p n?u khng ???c x? l c th? d?n ??n t?n th??ng ho?c m?t thnh l?c (ch?n th??ng kh p), v c th? tr? thnh v?nh vi?n. H??NG D?N CH?M [place] T?I NH   S? d?ng thu?c theo khuy?n ngh? c?a chuyn gia ch?m [place] s?c kh?e. Thu?c khng c?n k ??n s? gip khai thng ?ng tai v c th? c tc d?ng trong th?i gian ?i [place].  Khng cho b?t k? v?t g vo tai ?? v? sinh ho?c thng tai. Thu?c nh? tai s? khng c tc d?ng.  Khng b?i, l?n, ho?c ?i [place] cho ??n khi chuyn gia ch?m [place] s?c kh?e ni r?ng c th? lm vi?c ?. N?u nh?ng ho?t ??ng ny l c?n thi?t, hy nhai k?o cao su th??ng xuyn, c? nu?t n??c b?t c th? c tc d?ng. N?u qu v? b?t m?i v th?i nh? nhng lm ph?ng hai tai ?? cn b?ng s? thay ??i p su?t c?ng s? c tc d?ng. Lm nh? v?y s? p khng kh ?i vo trong vi nh?.  Ch? s? d?ng thu?c khng c?n k ??n thu?c ho?c thu?c c?n k ??n ?? gi?m ?au, gi?m c?m gic kh ch?u ho?c h? s?t theo ch? d?n c?a chuyn gia ch?m [place] s?c kh?e c?a qu v?.  Thu?c ch?ng sung huy?t c?ng c th? c tc d?ng trong vi?c ch?ng sung huy?t ? tai gi?a v lm cho vi?c cn b?ng p su?t d? dng h?n. ?I KHM N?U:  Qu v? b? chng m?t n?ng, c?m gic nh? th? c?n phng ?ang xoay trn v qu v? c?m th?y bu?n nn (chng m?t).  Tri?u ch?ng c?a qu v? ch? c ? m?t bn tai. NGAY L?P T?C ?I KHM N?U:   Qu v? b? ?au ??u n?ng, chng m?t, ho?c ?au tai n?ng.  Qu v? b? ti?t d?ch c mu ho?c trng nh? m? ? tai.  Qu v? b? s?t.  V?n ?? c?a qu v? khng c?i thi?n ho?c tr? nn t?i t? h?n. ??M B?O QU V?:   Hi?u cc h??ng d?n ny.  S? theo di tnh tr?ng c?a mnh.  S? yu c?u tr? gip ngay l?p t?c n?u b?n c?m th?y khng kh?e ho?c th?y tr?m tr?ng h?n.   Thng tin ny khng nh?m m?c ?ch thay th? cho l?i khuyn m chuyn gia ch?m [place] s?c kh?e ni v?i qu v?. Hy b?o ??m qu v? ph?i th?o lu?n b?t k? v?n ?? g m qu v? c v?i  chuyn gia ch?m  s?c kh?e c?a qu v?.   Document Released: 10/30/2005 Document Revised: 08/20/2013 Elsevier Interactive Patient Education [employer].

## 2015-11-25 NOTE — Progress Notes (Signed)
MRN: [id number]  Subjective:   Mr. Brian Preston is a 45 y.o. male presenting for annual physical exam, immunizations, tinnitus and insomnia.  Medical care team includes: PCP: No PCP Per Patient Specialists: None.   Patient is married for 21 years, has 2 children and 1 is planning on becoming a PA. He currently works in a [employer], used to work as a Lobbyist for 40-98 years. He changed jobs because he got tired of manual labor. Patient eats healthily and exercises regularly. Plans on traveling to [place] on 12/02/2015. Would like to have vaccinations per CDC.  Insomnia - Reports several month history of difficulty sleeping. Patient states that he has tried Yoga, otc sleep aids and avoiding lights in his bedroom at sleep time. He admits that he starts to think about a lot of things, feels stressed, thinks about his past, things he has to do. Denies depressed mood, feeling fearful or anxious, irritability. 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  Tinnitus - Reports several month history of intermittent tinnitus and nasal congestion. He denies ROS as below. He has not tried medications for this.  [facility] currently has no medications in their medication list. He has No Known Allergies.  [place]  has no past medical history on file. Also  has no past surgical history on file.  His family history includes Hypertension in his mother; Stroke in his mother.  Immunizations: Flu shot 08/14/2015, TDAP > 10 years ago.  Review of Systems  Constitutional: Negative for fever, chills, weight loss, malaise/fatigue and diaphoresis.  HENT: Positive for tinnitus. Negative for congestion, ear discharge, ear pain, hearing loss, nosebleeds and sore throat.   Eyes: Negative for blurred vision, double vision, photophobia, pain, discharge and redness.  Respiratory: Negative for cough, shortness of  breath and wheezing.   Cardiovascular: Negative for chest pain, palpitations and leg swelling.  Gastrointestinal: Negative for nausea, vomiting, abdominal pain, diarrhea, constipation and blood in stool.  Genitourinary: Negative for dysuria, urgency, frequency, hematuria and flank pain.  Musculoskeletal: Negative for myalgias, back pain and joint pain.  Skin: Negative for itching and rash.  Neurological: Negative for dizziness, tingling, seizures, loss of consciousness, weakness and headaches.  Endo/Heme/Allergies: Negative for polydipsia.  Psychiatric/Behavioral: Negative for depression, suicidal ideas, hallucinations, memory loss and substance abuse. The patient has insomnia. The patient is not nervous/anxious.     Objective:   Vitals: BP 117/74 mmHg  Pulse 84  Temp(Src) 98.2 F (36.8 C) (Oral)  Resp 16  Ht 5' 2.25" (1.581 m)  Wt 134 lb (60.782 kg)  BMI 24.32 kg/m2  SpO2 98%  Physical Exam  Constitutional: He is oriented to person, place, and time. He appears well-developed and well-nourished.  HENT:  TM's flat bilaterally, no effusions or erythema. Nasal turbinates boggy and edematous. No sinus tenderness. Postnasal drip present, without oropharyngeal exudates, erythema or abscesses.  Eyes: Conjunctivae and EOM are normal. Pupils are equal, round, and reactive to light. Right eye exhibits no discharge. Left eye exhibits no discharge. No scleral icterus.  Neck: Normal range of motion. Neck supple. No thyromegaly present.  Cardiovascular: Normal rate, regular rhythm and intact distal pulses.  Exam reveals no gallop and no friction rub.   No murmur heard. Pulmonary/Chest: No stridor. No respiratory distress. He has no wheezes. He has no rales.  Abdominal: Soft. Bowel sounds are normal. He exhibits  no distension and no mass. There is no tenderness.  Musculoskeletal: Normal range of motion. He exhibits no edema or tenderness.  Lymphadenopathy:    He has no cervical adenopathy.    Neurological: He is alert and oriented to person, place, and time.  Skin: Skin is warm and dry. No rash noted. No erythema. No pallor.  Psychiatric: He has a normal mood and affect.   Assessment and Plan :   1. Annual physical exam - Labs pending, patient is medically stable. - Discussed healthy lifestyle, diet, exercise, preventative care, vaccinations, and addressed patient's concerns.   2. Insomnia - Counseled on sleep hygiene. Start trial of Flexeril, stress is the major source of his insomnia but patient will try meditation and Flexeril. RTC if no improvement.  3. Tinnitus, right - Likely undergoing ETD, counseled on this diagnosis and recommended patient start a trial of Zyrtec to see if this helps. F/u after trip to [place].  4. History of hepatitis B - Hepatitis B surface antigen pending. Patient states that he was cleared by a physician after 2 visits. It is unclear to me that he received treatment. Follow up after results.  5. Need for Tdap vaccination - Tdap vaccine greater than or equal to 7yo IM  6. Need for prophylactic vaccination and inoculation against viral hepatitis - Hepatitis A vaccine adult IM  7. Need for immunization against typhoid - typhoid (VIVOTIF) DR capsule; Take 1 capsule by mouth every other day. X 4 doses.  Dispense: 4 capsule; Refill: 0   Wallis BambergMario Aanshi Batchelder, PA-C [facility] [phone] 11/25/2015  2:29 PM

## 2015-11-26 LAB — TSH: TSH: 4.887 u[IU]/mL — AB (ref 0.350–4.500)

## 2015-11-26 LAB — HEPATITIS B SURFACE ANTIGEN: HEP B S AG: POSITIVE — AB

## 2015-11-26 LAB — HEPATITIS B SURF AG CONFIRMATION: HEPATITIS B SURFACE ANTIGEN CONFIRMATION: POSITIVE — AB

## 2015-11-30 ENCOUNTER — Other Ambulatory Visit: Payer: Self-pay | Admitting: Urgent Care

## 2015-11-30 DIAGNOSIS — B181 Chronic viral hepatitis B without delta-agent: Secondary | ICD-10-CM

## 2016-11-27 ENCOUNTER — Encounter: Payer: Self-pay | Admitting: Family Medicine

## 2016-11-27 ENCOUNTER — Ambulatory Visit (INDEPENDENT_AMBULATORY_CARE_PROVIDER_SITE_OTHER): Payer: BLUE CROSS/BLUE SHIELD | Admitting: Family Medicine

## 2016-11-27 VITALS — BP 148/98 | HR 80 | Temp 98.0°F | Resp 16 | Ht 62.25 in | Wt 135.2 lb

## 2016-11-27 DIAGNOSIS — B181 Chronic viral hepatitis B without delta-agent: Secondary | ICD-10-CM | POA: Insufficient documentation

## 2016-11-27 DIAGNOSIS — R7989 Other specified abnormal findings of blood chemistry: Secondary | ICD-10-CM

## 2016-11-27 DIAGNOSIS — R946 Abnormal results of thyroid function studies: Secondary | ICD-10-CM

## 2016-11-27 DIAGNOSIS — R030 Elevated blood-pressure reading, without diagnosis of hypertension: Secondary | ICD-10-CM | POA: Diagnosis not present

## 2016-11-27 NOTE — Assessment & Plan Note (Signed)
Reports was told that he had HTN. His is concerned that his Bp has been elevated at night.  - advised to bring his bp machine with his next time that he comes  - follow up in 4 weeks to monitor his blood pressure  - if still elevated can consider starting a medication.

## 2016-11-27 NOTE — Addendum Note (Signed)
Addended by: Baldwin CrownJOHNSON, SHAQUETTA D on: 11/27/2016 11:46 AM   Modules accepted: Orders

## 2016-11-27 NOTE — Patient Instructions (Addendum)
  Thank you for coming in,   Please bring in your blood pressure machine the next time that you come in.   Please check your blood pressure once a week and bring these measurements.   We will call you with the results from today.   I have made a referral to Infectious Disease, please call us if you do not hear from their office.   I am also ordering an ultrasound for your liver. Please make sure this gets completed.    Please feel free to call with any questions or concerns at any time, at [phone]. --Dr. Jordan LikesSchmitz    IF you received an x-ray today, you will receive an invoice from [facility]. Please contact [facility] at [phone] with questions or concerns regarding your invoice.   IF you received labwork today, you will receive an invoice from [facility]. Please contact [facility] at [phone] with questions or concerns regarding your invoice.   Our billing staff will not be able to assist you with questions regarding bills from these companies.  You will be contacted with the lab results as soon as they are available. The fastest way to get your results is to activate your My Chart account. Instructions are located on the last page of this paperwork. If you have not heard from us regarding the results in 2 weeks, please contact this office.

## 2016-11-27 NOTE — Assessment & Plan Note (Signed)
Doesn't appear that he has had follow up in regards to this. He never had an US performed either. He was followed by ID.  - CMP  - referral to ID  - RUQ [place]

## 2016-11-27 NOTE — Progress Notes (Signed)
     Subjective:    Patient ID: Brian Preston, male    DOB: April 25, 1971, 46 y.o.   MRN: [id number]  Chief Complaint  Patient presents with  . Headache    tightness around head   . Hypertension    Checks them at home and systolic is in 170s    PCP: No PCP Per Patient  HPI  This is a 46 y.o. male who is presenting for a check up. He denies any past medical history. He is currently not taking any medications.   Elevated BP He reports that his blood pressure has been elevated at night in 170's.  Denies any chest pain or shortness or breath  His mother has suffered from a stroke.  He went back to [place] and was told that he has High BP and was started on a medication.   Hep B:  He was seen by ID in 2015. He reports that he was told that he was fine and didn't have to follow up.  He reports to drinking one bottle of beer.  He does not take tylenol on a regular basis.  Denies any changes in his stool  Doesn't appear to have had the RUQ US completed.  He doesn't know if his wife was tested for hep b He works at a Agricultural engineermanicurist.   Elevated TSH Found on lab review.  He currently doesn't report any symptoms  No goiter on observation.   Review of Systems  ROS: No unexpected weight loss, fever, chills, swelling, instability, muscle pain, numbness/tingling, redness, otherwise see HPI   PMH: none  Surgical: none  FHx: stroke, HTN  Social: no tobacco use, occasional alcohol use   No Known Allergies    Objective:   Physical Exam BP (!) 148/98   Pulse 80   Temp 98 F (36.7 C) (Oral)   Resp 16   Ht 5' 2.25" (1.581 m)   Wt 135 lb 3.2 oz (61.3 kg)   SpO2 98%   BMI 24.53 kg/m  Gen: NAD, alert, cooperative with exam, well-appearing HEENT: NCAT, EOMI, clear conjunctiva, no goiter or nodule palpated  CV: RRR, good S1/S2, no murmur, no edema, capillary refill brisk  Resp: CTABL, no wheezes, non-labored Abd: SNTND, BS present, no guarding or organomegaly Skin: no rashes, normal  turgor  Neuro: no gross deficits.  Psych: alert and oriented      Assessment & Plan:   Chronic hepatitis B (HCC) Doesn't appear that he has had follow up in regards to this. He never had an US performed either. He was followed by ID.  - CMP  - referral to ID  - RUQ US   Elevated TSH Currently asymptomatic today. This was elevated one year ago.  - TSH, free t3, free 4    Elevated BP without diagnosis of hypertension Reports was told that he had HTN. His is concerned that his Bp has been elevated at night.  - advised to bring his bp machine with his next time that he comes  - follow up in 4 weeks to monitor his blood pressure  - if still elevated can consider starting a medication.

## 2016-11-27 NOTE — Assessment & Plan Note (Signed)
Currently asymptomatic today. This was elevated one year ago.  - TSH, free t3, free 4

## 2016-11-28 LAB — COMPREHENSIVE METABOLIC PANEL

## 2016-11-28 LAB — T3, FREE

## 2016-11-28 LAB — TSH: TSH: 4.02 u[IU]/mL (ref 0.450–4.500)

## 2016-11-28 LAB — T4, FREE

## 2016-11-29 LAB — COMPREHENSIVE METABOLIC PANEL
A/G RATIO: 1.3 (ref 1.2–2.2)
ALT: 31 IU/L (ref 0–44)
AST: 22 IU/L (ref 0–40)
Albumin: 4 g/dL (ref 3.5–5.5)
Alkaline Phosphatase: 60 IU/L (ref 39–117)
BUN/Creatinine Ratio: 20 (ref 9–20)
BUN: 15 mg/dL (ref 6–24)
Bilirubin Total: 0.5 mg/dL (ref 0.0–1.2)
CALCIUM: 9.2 mg/dL (ref 8.7–10.2)
CO2: 20 mmol/L (ref 18–29)
Chloride: 101 mmol/L (ref 96–106)
Creatinine, Ser: 0.75 mg/dL — ABNORMAL LOW (ref 0.76–1.27)
GFR, EST AFRICAN AMERICAN: 129 mL/min/{1.73_m2} (ref >59)
GFR, EST NON AFRICAN AMERICAN: 112 mL/min/{1.73_m2} (ref >59)
GLUCOSE: 89 mg/dL (ref 65–99)
Globulin, Total: 3.1 g/dL (ref 1.5–4.5)
Potassium: 4.3 mmol/L (ref 3.5–5.2)
Sodium: 140 mmol/L (ref 134–144)
TOTAL PROTEIN: 7.1 g/dL (ref 6.0–8.5)

## 2016-11-29 LAB — T3, FREE: T3, Free: 3.5 pg/mL (ref 2.0–4.4)

## 2016-11-29 LAB — SPECIMEN STATUS REPORT

## 2016-11-29 LAB — T4, FREE: Free T4: 1.39 ng/dL (ref 0.82–1.77)

## 2016-12-04 ENCOUNTER — Other Ambulatory Visit: Payer: PRIVATE HEALTH INSURANCE

## 2016-12-04 ENCOUNTER — Ambulatory Visit
Admission: RE | Admit: 2016-12-04 | Discharge: 2016-12-04 | Disposition: A | Discharge status: Home / Self Care | Payer: BLUE CROSS/BLUE SHIELD | Source: Ambulatory Visit | Attending: Family Medicine | Admitting: Family Medicine

## 2016-12-04 DIAGNOSIS — B181 Chronic viral hepatitis B without delta-agent: Secondary | ICD-10-CM

## 2017-04-03 ENCOUNTER — Emergency Department (HOSPITAL_COMMUNITY)
Admission: EM | Admit: 2017-04-03 | Discharge: 2017-04-03 | Disposition: A | Discharge status: Home / Self Care | Payer: BLUE CROSS/BLUE SHIELD | Attending: Emergency Medicine | Admitting: Emergency Medicine

## 2017-04-03 ENCOUNTER — Ambulatory Visit (INDEPENDENT_AMBULATORY_CARE_PROVIDER_SITE_OTHER): Payer: BLUE CROSS/BLUE SHIELD | Admitting: Internal Medicine

## 2017-04-03 ENCOUNTER — Encounter (HOSPITAL_COMMUNITY): Payer: Self-pay | Admitting: Emergency Medicine

## 2017-04-03 ENCOUNTER — Encounter: Payer: Self-pay | Admitting: Internal Medicine

## 2017-04-03 ENCOUNTER — Encounter: Payer: Self-pay | Admitting: Family Medicine

## 2017-04-03 ENCOUNTER — Ambulatory Visit (INDEPENDENT_AMBULATORY_CARE_PROVIDER_SITE_OTHER): Payer: BLUE CROSS/BLUE SHIELD | Admitting: Family Medicine

## 2017-04-03 ENCOUNTER — Emergency Department (HOSPITAL_COMMUNITY): Payer: BLUE CROSS/BLUE SHIELD

## 2017-04-03 VITALS — BP 179/124 | HR 79 | Temp 97.8°F | Ht 63.0 in | Wt 138.0 lb

## 2017-04-03 VITALS — BP 178/120 | HR 85 | Temp 98.2°F | Resp 16 | Ht 62.0 in | Wt 138.2 lb

## 2017-04-03 DIAGNOSIS — I10 Essential (primary) hypertension: Secondary | ICD-10-CM | POA: Diagnosis not present

## 2017-04-03 DIAGNOSIS — Z79899 Other long term (current) drug therapy: Secondary | ICD-10-CM | POA: Insufficient documentation

## 2017-04-03 DIAGNOSIS — I160 Hypertensive urgency: Secondary | ICD-10-CM | POA: Diagnosis not present

## 2017-04-03 DIAGNOSIS — R51 Headache: Secondary | ICD-10-CM | POA: Diagnosis not present

## 2017-04-03 DIAGNOSIS — R519 Headache, unspecified: Secondary | ICD-10-CM

## 2017-04-03 DIAGNOSIS — R42 Dizziness and giddiness: Secondary | ICD-10-CM

## 2017-04-03 DIAGNOSIS — R030 Elevated blood-pressure reading, without diagnosis of hypertension: Secondary | ICD-10-CM | POA: Diagnosis not present

## 2017-04-03 DIAGNOSIS — B181 Chronic viral hepatitis B without delta-agent: Secondary | ICD-10-CM

## 2017-04-03 DIAGNOSIS — H538 Other visual disturbances: Secondary | ICD-10-CM | POA: Insufficient documentation

## 2017-04-03 DIAGNOSIS — Z87891 Personal history of nicotine dependence: Secondary | ICD-10-CM | POA: Diagnosis not present

## 2017-04-03 LAB — COMPREHENSIVE METABOLIC PANEL
ALK PHOS: 58 U/L (ref 38–126)
ALT: 24 U/L (ref 17–63)
ANION GAP: 7 (ref 5–15)
AST: 20 U/L (ref 15–41)
Albumin: 3.5 g/dL (ref 3.5–5.0)
BILIRUBIN TOTAL: 0.4 mg/dL (ref 0.3–1.2)
BUN: 17 mg/dL (ref 6–20)
CHLORIDE: 102 mmol/L (ref 101–111)
CO2: 31 mmol/L (ref 22–32)
Calcium: 9 mg/dL (ref 8.9–10.3)
Creatinine, Ser: 1.01 mg/dL (ref 0.61–1.24)
GFR calc Af Amer: >60 mL/min (ref >60)
Glucose, Bld: 84 mg/dL (ref 65–99)
POTASSIUM: 3.4 mmol/L — AB (ref 3.5–5.1)
Sodium: 140 mmol/L (ref 135–145)
TOTAL PROTEIN: 6.7 g/dL (ref 6.5–8.1)

## 2017-04-03 LAB — URINALYSIS, ROUTINE W REFLEX MICROSCOPIC
BACTERIA UA: NONE SEEN
BILIRUBIN URINE: NEGATIVE
Glucose, UA: NEGATIVE mg/dL
KETONES UR: NEGATIVE mg/dL
LEUKOCYTES UA: NEGATIVE
Nitrite: NEGATIVE
PH: 5 (ref 5.0–8.0)
PROTEIN: 100 mg/dL — AB
SQUAMOUS EPITHELIAL / LPF: NONE SEEN
Specific Gravity, Urine: 1.018 (ref 1.005–1.030)

## 2017-04-03 LAB — CBC
HEMATOCRIT: 49.1 % (ref 38.5–50.0)
HEMOGLOBIN: 16.3 g/dL (ref 13.2–17.1)
MCH: 28 pg (ref 27.0–33.0)
MCHC: 33.2 g/dL (ref 32.0–36.0)
MCV: 84.4 fL (ref 80.0–100.0)
MPV: 9 fL (ref 7.5–12.5)
Platelets: 234 10*3/uL (ref 140–400)
RBC: 5.82 MIL/uL — ABNORMAL HIGH (ref 4.20–5.80)
RDW: 16.3 % — AB (ref 11.0–15.0)
WBC: 7.7 10*3/uL (ref 3.8–10.8)

## 2017-04-03 LAB — CBC WITH DIFFERENTIAL/PLATELET
BASOS ABS: 0 10*3/uL (ref 0.0–0.1)
Basophils Relative: 0 %
Eosinophils Absolute: 0.8 10*3/uL — ABNORMAL HIGH (ref 0.0–0.7)
Eosinophils Relative: 6 %
HEMATOCRIT: 44.2 % (ref 39.0–52.0)
HEMOGLOBIN: 15.2 g/dL (ref 13.0–17.0)
LYMPHS PCT: 28 %
Lymphs Abs: 3.3 10*3/uL (ref 0.7–4.0)
MCH: 28.7 pg (ref 26.0–34.0)
MCHC: 34.4 g/dL (ref 30.0–36.0)
MCV: 83.6 fL (ref 78.0–100.0)
Monocytes Absolute: 0.8 10*3/uL (ref 0.1–1.0)
Monocytes Relative: 7 %
NEUTROS ABS: 7 10*3/uL (ref 1.7–7.7)
NEUTROS PCT: 59 %
PLATELETS: 223 10*3/uL (ref 150–400)
RBC: 5.29 MIL/uL (ref 4.22–5.81)
RDW: 14.7 % (ref 11.5–15.5)
WBC: 11.9 10*3/uL — AB (ref 4.0–10.5)

## 2017-04-03 LAB — COMPLETE METABOLIC PANEL WITH GFR
ALBUMIN: 4.1 g/dL (ref 3.6–5.1)
ALK PHOS: 64 U/L (ref 40–115)
ALT: 23 U/L (ref 9–46)
AST: 19 U/L (ref 10–40)
BUN: 15 mg/dL (ref 7–25)
CALCIUM: 9.2 mg/dL (ref 8.6–10.3)
CO2: 29 mmol/L (ref 20–31)
Chloride: 102 mmol/L (ref 98–110)
Creat: 0.95 mg/dL (ref 0.60–1.35)
Glucose, Bld: 91 mg/dL (ref 65–99)
POTASSIUM: 4.4 mmol/L (ref 3.5–5.3)
Sodium: 138 mmol/L (ref 135–146)
Total Bilirubin: 0.6 mg/dL (ref 0.2–1.2)
Total Protein: 7.1 g/dL (ref 6.1–8.1)

## 2017-04-03 LAB — HEPATITIS B SURF AG CONFIRMATION: Hepatitis B Surf Ag Confirmation: POSITIVE — AB

## 2017-04-03 LAB — HIV ANTIBODY (ROUTINE TESTING W REFLEX): HIV: NONREACTIVE

## 2017-04-03 LAB — HEPATITIS B SURFACE ANTIGEN: Hepatitis B Surface Ag: POSITIVE — AB

## 2017-04-03 LAB — HEPATITIS C ANTIBODY: HCV AB: NEGATIVE

## 2017-04-03 LAB — HEPATITIS B SURFACE ANTIBODY,QUALITATIVE: Hep B S Ab: NEGATIVE

## 2017-04-03 LAB — GLUCOSE, POCT (MANUAL RESULT ENTRY): POC Glucose: 74 mg/dl (ref 70–99)

## 2017-04-03 LAB — HEPATITIS B CORE ANTIBODY, TOTAL: HEP B C TOTAL AB: REACTIVE — AB

## 2017-04-03 MED ORDER — IBUPROFEN 200 MG PO TABS
600.0000 mg | ORAL_TABLET | Freq: Once | ORAL | Status: AC
  Administered 2017-04-03: 600 mg via ORAL
  Filled 2017-04-03: qty 3

## 2017-04-03 MED ORDER — HYDROCHLOROTHIAZIDE 12.5 MG PO TABS: 12.5000 mg | ORAL_TABLET | Freq: Every day | ORAL | 0 refills | Status: DC

## 2017-04-03 NOTE — Assessment & Plan Note (Signed)
BP is very elevated now.  Will alert his PCP.

## 2017-04-03 NOTE — ED Triage Notes (Signed)
Pt from Urgent care with complaints of hypertension for which pt has been on and off bp meds for "a long time" pt states he has had a headache for 1 year and blurred vision x 2 weeks.

## 2017-04-03 NOTE — Assessment & Plan Note (Addendum)
Will check his labs today.  Since he is likely still E Ag-negative with elevated LFTs, high DNA, treatment would be indicated.  Will also check elastography.   Follow up after above and consider tenofovir

## 2017-04-03 NOTE — ED Notes (Signed)
Pt reports Urgent Care administered an antihypertensive prior to sending pt by ambulance, pt unsure of which medication he received.

## 2017-04-03 NOTE — Discharge Instructions (Signed)
Please take this blood pressure medication.  You need to follow up with your doctor as soon as possible for evaluation of your high blood pressure and your abnormal heart electrical activity.    While in the ED your blood pressure was high.  Please follow up with your primary care doctor or the wellness clinic for repeat evaluation as you may need medication.  High blood pressure can cause long term, potentially serious, damage if left untreated.

## 2017-04-03 NOTE — ED Provider Notes (Signed)
[facility]-EMERGENCY DEPT Provider Note   CSN: [id number] Arrival date & time: 04/03/17  1316     History   Chief Complaint Chief Complaint  Patient presents with  . Hypertension  . Headache  . Blurred Vision    HPI Brian Preston is a 46 y.o. male with a history of elevated BP, Chronic hep B who presents via ems for concern for HTN.  He was at an outpatient facility today for infectious disease for hep B follow up when he was found to be hypertensive with BP 179/124. He went to an urgent care where he was again found to have elevated BP with headache and blurred vision, given 0.1mg  clonidine and transferred here by EMS.  His headache was initially 7-8/10, is now 2-3/10, states he experienced relief of headache and blurred vision after being given clonidine.  He reports that for the past two week he has been experiencing daily headaches every day around 11-noon that last the remainder of the day.  He has been experiencing blurred vision with the headaches and occasionally sees "twinkling stars" in his vision.  He was previously treated for HTN in his home country of [place] but he is unsure what medication he took.  He denies chest pain, shortness of breath, nausea, abdominal pain, diarrhea or constipation.  No fevers, chills, hot flashes or decreased appetite.    He has a home blood pressure machine and reports he is often "high" at home with systolic's in the 160s.   HPI  History reviewed. No pertinent past medical history.  Patient Active Problem List   Diagnosis Date Noted  . Elevated BP without diagnosis of hypertension 11/27/2016  . Elevated TSH 11/27/2016  . Chronic hepatitis B (HCC) 11/27/2016    History reviewed. No pertinent surgical history.     Home Medications    Prior to Admission medications   Medication Sig Start Date End Date Taking? Authorizing Provider  hydrochlorothiazide (HYDRODIURIL) 12.5 MG tablet Take 1 tablet (12.5 mg total) by mouth daily. 04/03/17   Cristina Gong, PA-C    Family History Family History  Problem Relation Age of Onset  . Hypertension Mother   . Stroke Mother     Social History Social History  Substance Use Topics  . Smoking status: Former Smoker    Quit date: 11/22/1992  . Smokeless tobacco: Never Used  . Alcohol use 0.6 oz/week    1 Cans of beer per week     Comment: social     Allergies   Patient has no known allergies.   Review of Systems Review of Systems  Constitutional: Negative for activity change, appetite change, chills, diaphoresis, fatigue and fever.  HENT: Negative for congestion and sore throat.   Eyes: Positive for visual disturbance. Negative for photophobia.  Respiratory: Negative for cough, choking, chest tightness, shortness of breath and wheezing.   Cardiovascular: Negative for chest pain, palpitations and leg swelling.  Gastrointestinal: Negative for abdominal distention, abdominal pain, diarrhea, nausea and vomiting.  Endocrine: Negative for polydipsia, polyphagia and polyuria.  Genitourinary: Negative for decreased urine volume, difficulty urinating, frequency, hematuria and urgency.  Musculoskeletal: Negative for arthralgias, back pain, joint swelling, myalgias, neck pain and neck stiffness.  Neurological: Positive for headaches. Negative for dizziness, tremors, syncope, facial asymmetry, speech difficulty, weakness, light-headedness and numbness.     Physical Exam Updated Vital Signs BP (!) 133/97   Pulse 63   Temp 98.2 F (36.8 C) (Oral)   Resp 14   Ht 5\' 3"  (  1.6 m)   Wt 62.6 kg (138 lb)   SpO2 96%   BMI 24.45 kg/m   Physical Exam  Constitutional: He is oriented to person, place, and time. He appears well-developed and well-nourished. No distress.  HENT:  Head: Normocephalic and atraumatic. Head is without raccoon's eyes, without Battle's sign and without laceration.  Right Ear: External ear normal.  Left Ear: External ear normal.  Nose: Nose normal.  Mouth/Throat:  Uvula is midline, oropharynx is clear and moist and mucous membranes are normal.  Patient reports that his paraspinal muscles feel tight.  Reports that palpation "feels good" and helps with the tight feeling.  He is tender to left TMJ, that it feels tight.    Eyes: Conjunctivae and EOM are normal. Pupils are equal, round, and reactive to light. No scleral icterus.  Neck: Normal range of motion. Neck supple. No JVD present.  Cardiovascular: Normal rate, regular rhythm, normal heart sounds and intact distal pulses.  Exam reveals no friction rub.   No murmur heard. Pulmonary/Chest: Effort normal and breath sounds normal. No stridor. No respiratory distress. He has no wheezes.  Abdominal: Soft. Bowel sounds are normal. He exhibits no distension and no mass. There is no tenderness.  Musculoskeletal: Normal range of motion. He exhibits no deformity.       Cervical back: He exhibits tenderness (Tender to paraspinal muscles, says it feels "tight"). He exhibits normal range of motion, no bony tenderness, no swelling, no edema, no deformity, no pain and no spasm.  Neurological: He is alert and oriented to person, place, and time. He has normal strength. He displays no atrophy and no tremor. No cranial nerve deficit (Cranial nerves III-XII intact) or sensory deficit (Sensation equal and intact to touch bilateral extremities and turnk). He exhibits normal muscle tone. Coordination and gait normal. GCS eye subscore is 4. GCS verbal subscore is 5. GCS motor subscore is 6.  Skin: Skin is warm and dry. No rash noted. He is not diaphoretic.  Psychiatric: He has a normal mood and affect. His behavior is normal.  Nursing note and vitals reviewed.    ED Treatments / Results  Labs (all labs ordered are listed, but only abnormal results are displayed) Labs Reviewed  COMPREHENSIVE METABOLIC PANEL - Abnormal; Notable for the following:       Result Value   Potassium 3.4 (*)    All other components within normal  limits  CBC WITH DIFFERENTIAL/PLATELET - Abnormal; Notable for the following:    WBC 11.9 (*)    Eosinophils Absolute 0.8 (*)    All other components within normal limits  URINALYSIS, ROUTINE W REFLEX MICROSCOPIC - Abnormal; Notable for the following:    Hgb urine dipstick MODERATE (*)    Protein, ur 100 (*)    All other components within normal limits    EKG  EKG Interpretation  Date/Time:  Tuesday Apr 03 2017 17:58:25 EDT Ventricular Rate:  89 PR Interval:    QRS Duration: 92 QT Interval:  393 QTC Calculation: 479 R Axis:   65 Text Interpretation:  Sinus rhythm ST elevation suggests acute pericarditis Borderline prolonged QT interval No significant change since last tracing Confirmed by Corlis LeakMackuen, Courteney ([id number]) on 04/03/2017 6:05:15 PM       Radiology Ct Head Wo Contrast  Result Date: 04/03/2017 CLINICAL DATA:  Headache and blurred vision EXAM: CT HEAD WITHOUT CONTRAST TECHNIQUE: Contiguous axial images were obtained from the base of the skull through the vertex without intravenous contrast. COMPARISON:  CT  head 12/11/2006 FINDINGS: Brain: No evidence of acute infarction, hemorrhage, hydrocephalus, extra-axial collection or mass lesion/mass effect. Vascular: No hyperdense vessel or unexpected calcification. Skull: Negative Sinuses/Orbits: Mucosal edema paranasal sinuses.  Normal orbit. Other: None IMPRESSION: Negative CT of the brain Mucosal edema paranasal sinuses. Electronically Signed   By: Marlan Palau M.D.   On: 04/03/2017 16:16    Procedures Procedures (including critical care time)  Medications Ordered in ED Medications  ibuprofen (ADVIL,MOTRIN) tablet 600 mg (600 mg Oral Given 04/03/17 1554)     Initial Impression / Assessment and Plan / ED Course  I have reviewed the triage vital signs and the nursing notes.  Pertinent labs & imaging results that were available during my care of the patient were reviewed by me and considered in my medical decision making (see  chart for details).    Brian Preston presents with symptoms consistent with hypertensive urgency.  He was treated for his elevated blood pressure with clonidine which resolved his headache and blurred vision.  Based on his history lab work was performed to evaluated for end organ dysfunction and CT brain was obtained with out abnormality or evidence of hemorrhage.    Labs previously drawn this morning at were not initially available so were repeated.  Physical exam is more suggestive of a tension type headache than from HTN.  EKG with out acute ischemic changes.  Diffuse ST elevation noted, patient is with out chest pain/discomfort, shortness of breath, or cardiac symptoms.  Patient was observed for multiple hours in the ED and states his headache continued to improve.  At discharge patient is not complaining of headache or blurred vision.  No neck stiffness or pain with normal neuro exam.  Patient will be discharged with HTCZ and instructions to follow up with PCP for evaluation of his blood pressure and EKG.  Patient was given strict return precautions and voiced his understanding.  Printed instructions were provided for the patient.  The patient was discussed with Dr. Eudelia Bunch who agrees with my plan.     Final Clinical Impressions(s) / ED Diagnoses   Final diagnoses:  Hypertensive urgency  Blurred vision, bilateral    New Prescriptions Discharge Medication List as of 04/03/2017  6:37 PM    START taking these medications   Details  hydrochlorothiazide (HYDRODIURIL) 12.5 MG tablet Take 1 tablet (12.5 mg total) by mouth daily., Starting Tue 04/03/2017, Print         Cristina Gong, PA-C 04/03/17 1855    Nira Conn, MD 04/04/17 [phone]

## 2017-04-03 NOTE — Progress Notes (Addendum)
By signing my name below, I, Mesha Guinyard, attest that this documentation has been prepared under the direction and in the presence of Meredith Staggers, MD.  Electronically Signed: Arvilla Market, Medical Scribe. 04/03/17. 11:57 AM.  Subjective:    Patient ID: Brian Preston, male    DOB: 01/11/1971, 46 y.o.   MRN: [id number]  HPI Chief Complaint  Patient presents with  . Hypertension    per patient, visited another office appx 1 hour ago, BP was 179/124; wants checked today  . Headache    per patient, began appx 2 weeks    HPI Comments: Brian Preston is a 46 y.o. male who presents to Primary Care at [facility] complaining of HA onset months but more noticeable for the last 2 weeks and HTN. The last time he was seen here was 11/2016, at that time his bp was 148/98. He was advised to follow-up in 4 weeks with his home monitor. He did have some blood work drawn today from infectious disease, but renal function was nl with creatinine 0.75 in Jan 2018. He has been treated for chronic Hep B by infectious disease. BP at his visit there was 179/124 today.  Reports HA is occasionally located at the base of his head to his neck. Intermittent for the past month and HAs typically start in the afternoon and his sxs eventually resolve. Reports associated sxs of dizziness, and visual disturbance. HA was worse yesterday than today, but today he's had persistent blurry vision with slight dizziness, and "tight" HA all day. Pt at home is always high. He last took medication for HTN a year ago while in his country, and he takes 3 tabs of advil every day for little relief of his HA. Pt had his sxs checked out a couple of months ago for HTN, and HA. for HTN Pt exercises without difficulty. Denies drinking coffee, soda, herbal supplements, caffinated drinks, or illicit drugs (i.e. cocaine). Denies weakness, diplopia, numbness, chest pains,  BP Readings from Last 3 Encounters:  04/03/17 (!) 173/120  04/03/17 (!) 179/124    11/27/16 (!) 148/98   Patient Active Problem List   Diagnosis Date Noted  . Elevated BP without diagnosis of hypertension 11/27/2016  . Elevated TSH 11/27/2016  . Chronic hepatitis B (HCC) 11/27/2016   No past medical history on file. No past surgical history on file. No Known Allergies Prior to Admission medications   Medication Sig Start Date End Date Taking? Authorizing Provider  cyclobenzaprine (FLEXERIL) 10 MG tablet Take 0.5-1 tablets (5-10 mg total) by mouth at bedtime. Patient not taking: Reported on 04/03/2017 11/25/15   Wallis Bamberg, PA-C  typhoid (VIVOTIF) DR capsule Take 1 capsule by mouth every other day. X 4 doses. Patient not taking: Reported on 11/27/2016 11/25/15   Wallis Bamberg, PA-C   Social History   Social History  . Marital status: Married    Spouse name: N/A  . Number of children: N/A  . Years of education: N/A   Occupational History  . Not on file.   Social History Main Topics  . Smoking status: Former Smoker    Quit date: 11/22/1992  . Smokeless tobacco: Never Used  . Alcohol use 0.6 oz/week    1 Cans of beer per week     Comment: social  . Drug use: No  . Sexual activity: Not on file   Other Topics Concern  . Not on file   Social History Narrative  . No narrative on file   Review of  Systems  Eyes: Positive for visual disturbance.  Cardiovascular: Negative for chest pain.  Neurological: Positive for dizziness and headaches. Negative for weakness and numbness.   Objective:  Physical Exam  Constitutional: He appears well-developed and well-nourished. No distress.  HENT:  Head: Normocephalic and atraumatic.  Eyes: Conjunctivae and EOM are normal. Pupils are equal, round, and reactive to light. Right eye exhibits no nystagmus. Left eye exhibits no nystagmus.  Neck: Neck supple.  Cardiovascular: Normal rate.   Pulmonary/Chest: Effort normal.  Neurological: He is alert. He displays a negative Romberg sign.  No pronator drift Nl finger to  nose Nl heel to toe  No focal weakness  Skin: Skin is warm and dry.  Psychiatric: He has a normal mood and affect. His behavior is normal.  Nursing note and vitals reviewed.   Vitals:   04/03/17 1032 04/03/17 1104  BP: (!) 162/118 (!) 173/120  Pulse: 85   Resp: 16   Temp: 98.2 F (36.8 C)   TempSrc: Oral   SpO2: 98%   Weight: 138 lb 3.2 oz (62.7 kg)   Height: 5\' 2"  (1.575 m)   Body mass index is 25.28 kg/m.   Results for orders placed or performed in visit on 04/03/17  POCT glucose (manual entry)  Result Value Ref Range   POC Glucose 74 70 - 99 mg/dl   Assessment & Plan:  Brian Preston is a 46 y.o. male Blurred vision - Plan: POCT glucose (manual entry)  Accelerated hypertension  Dizziness - Plan: POCT glucose (manual entry)  Occipital headache  Reported history of hypertension the past, off medications at this time. Office visit a few months ago with borderline elevated blood pressure, now much higher. Endorses symptoms of headache, dizziness, episodic blurry vision over past 2 weeks but usually come and go throughout the day,, but the symptoms have been persistent all day today. Headache was worse yesterday than today. Nonfocal neuro exam, no pronator drift, no focal weakness.  -Due to degree of hypertension today with associated persistent headache, dizziness and blurry vision, possibility of intracranial bleed - will transport by EMS to ER for possible neuroimaging and controlled lowering of pressure. Clonidine 0.1 mg was given in office at 12:45 PM. [facility]. 12:54 PM Report given to EMS with transfer of care.  No orders of the defined types were placed in this encounter.  Patient Instructions       IF you received an x-ray today, you will receive an invoice from [facility]. Please contact [facility] at [phone] with questions or concerns regarding your invoice.   IF you received labwork today, you will receive an  invoice from [facility]. Please contact [facility] at [phone] with questions or concerns regarding your invoice.   Our billing staff will not be able to assist you with questions regarding bills from these companies.  You will be contacted with the lab results as soon as they are available. The fastest way to get your results is to activate your My Chart account. Instructions are located on the last page of this paperwork. If you have not heard from us regarding the results in 2 weeks, please contact this office.      I personally performed the services described in this documentation, which was scribed in my presence. The recorded information has been reviewed and considered for accuracy and completeness, addended by me as needed, and agree with information above.  Signed,   Meredith StaggersJeffrey Belanna Manring, MD Primary Care at [facility].  04/03/17 12:39 PM

## 2017-04-03 NOTE — Patient Instructions (Signed)
     IF you received an x-ray today, you will receive an invoice from [facility]. Please contact  Radiology at [phone] with questions or concerns regarding your invoice.   IF you received labwork today, you will receive an invoice from [facility]. Please contact [facility] at [phone] with questions or concerns regarding your invoice.   Our billing staff will not be able to assist you with questions regarding bills from these companies.  You will be contacted with the lab results as soon as they are available. The fastest way to get your results is to activate your My Chart account. Instructions are located on the last page of this paperwork. If you have not heard from us regarding the results in 2 weeks, please contact this office.     

## 2017-04-03 NOTE — Progress Notes (Signed)
   Subjective:    Patient ID: Brian Preston, male    DOB: 01-Dec-1970, 46 y.o.   MRN: [id number]  HPI He is here for follow up of chronic hepatitis B. He was seen as a new patient by Dr. Drue SecondSnider in 2015 and is hepatitis B E Ag-negative, viral DNA elevated at 713,000 IU and elevated AST and ALT.  He did not return after that initial appointment.  He does not know of any hepatitis B in his family, no history of IVDU.  He was born in [place].  Hepatitits A immune.  He has never been on treatment for hepatitis B.   He also has had persistently high blood pressure when taken at home by a machine and manually by his son who is a Engineer, sitemedical assistant.  I reviewed his previous record and he has had a recent ultrasound in January 2018 and no signs of cirrhosis, never had an elastography.  Also was HIV negative.    Review of Systems  Constitutional: Negative for fatigue.  Gastrointestinal: Negative for diarrhea and nausea.  Skin: Negative for rash.  Neurological: Positive for headaches. Negative for dizziness and light-headedness.       Objective:   Physical Exam  Constitutional: He appears well-developed and well-nourished. No distress.  HENT:  Mouth/Throat: No oropharyngeal exudate.  Eyes: No scleral icterus.  Cardiovascular: Normal rate, regular rhythm and normal heart sounds.   No murmur heard. Pulmonary/Chest: Effort normal and breath sounds normal. No respiratory distress.  Abdominal: Soft. Bowel sounds are normal. He exhibits no distension. There is no tenderness.  Lymphadenopathy:    He has no cervical adenopathy.  Skin: No rash noted.   SH: former smoker, occasional alcohol       Assessment & Plan:

## 2017-04-03 NOTE — ED Notes (Signed)
Per chart review, pt was given clonidine 0.1mg  PO prior to transport by EMS.

## 2017-04-03 NOTE — ED Notes (Signed)
Pt has been transported to CT.

## 2017-04-04 ENCOUNTER — Encounter: Payer: Self-pay | Admitting: Physician Assistant

## 2017-04-04 ENCOUNTER — Ambulatory Visit (INDEPENDENT_AMBULATORY_CARE_PROVIDER_SITE_OTHER): Payer: BLUE CROSS/BLUE SHIELD | Admitting: Physician Assistant

## 2017-04-04 VITALS — BP 161/123 | HR 75 | Temp 98.6°F | Resp 16 | Ht 63.0 in | Wt 139.0 lb

## 2017-04-04 DIAGNOSIS — I10 Essential (primary) hypertension: Secondary | ICD-10-CM | POA: Insufficient documentation

## 2017-04-04 DIAGNOSIS — E7800 Pure hypercholesterolemia, unspecified: Secondary | ICD-10-CM

## 2017-04-04 MED ORDER — LISINOPRIL-HYDROCHLOROTHIAZIDE 10-12.5 MG PO TABS: 1.0000 | ORAL_TABLET | Freq: Every day | ORAL | 0 refills | Status: DC

## 2017-04-04 NOTE — Progress Notes (Signed)
   Brian Preston  MRN: [id number] DOB: 1971/06/18  PCP: Morrell RiddleWeber, Fruma Africa L, PA-C  Chief Complaint  Patient presents with  . Follow-up    HTN, was sent to the ED by Neva SeatGreene yesterday     Subjective:  Pt presents to clinic for follow-up of his BP- he was seen yesterday here for HTN emergency and then sent to the ED - he was started on HCTZ but told to f/u with PCP.  Today he feels fine without CP, SOB, vision changes.  Review of Systems  Respiratory: Negative for cough and shortness of breath.   Cardiovascular: Negative for chest pain, palpitations and leg swelling.    Patient Active Problem List   Diagnosis Date Noted  . Essential hypertension 04/04/2017  . Elevated TSH 11/27/2016  . Chronic hepatitis B (HCC) 11/27/2016    Current Outpatient Prescriptions on File Prior to Visit  Medication Sig Dispense Refill  . hydrochlorothiazide (HYDRODIURIL) 12.5 MG tablet Take 1 tablet (12.5 mg total) by mouth daily. (Patient not taking: Reported on 04/04/2017) 7 tablet 0   No current facility-administered medications on file prior to visit.     No Known Allergies  Pt patients past, family and social history were reviewed and updated.   Objective:  BP (!) 161/123   Pulse 75   Temp 98.6 F (37 C) (Oral)   Resp 16   Ht 5\' 3"  (1.6 m)   Wt 139 lb (63 kg)   SpO2 98%   BMI 24.62 kg/m   Physical Exam  Constitutional: He is oriented to person, place, and time and well-developed, well-nourished, and in no distress.  HENT:  Head: Normocephalic and atraumatic.  Right Ear: External ear normal.  Left Ear: External ear normal.  Eyes: Conjunctivae are normal.  Neck: Normal range of motion.  Cardiovascular: Normal rate, regular rhythm, normal heart sounds and intact distal pulses.   Pulmonary/Chest: Effort normal and breath sounds normal. He has no wheezes.  Musculoskeletal:       Right lower leg: He exhibits no edema.       Left lower leg: He exhibits no edema.  Neurological: He is alert and  oriented to person, place, and time. Gait normal.  Skin: Skin is warm and dry.  Psychiatric: Mood, memory, affect and judgment normal.    Assessment and Plan :  Essential hypertension - Plan: lisinopril-hydrochlorothiazide (PRINZIDE,ZESTORETIC) 10-12.5 MG tablet  Elevated LDL cholesterol level - Plan: Lipid panel - check labs from blood draw yesterday.  Change to Lisinopril/HCTZ 10/12.5 - take daily in the am - recheck with me in 7-10d - d/w pt that if he develops any further symptoms like yesterday RTC.  Benny LennertSarah Luara Faye PA-C  Primary Care at [facility] 04/04/2017 10:44 AM

## 2017-04-04 NOTE — Patient Instructions (Addendum)
  Stop the mediation from the ER - start the medication that I sent to your pharmacy - I will see you in a week at your f/u appt     IF you received an x-ray today, you will receive an invoice from [facility]. Please contact [facility] at [phone] with questions or concerns regarding your invoice.   IF you received labwork today, you will receive an invoice from [facility]. Please contact [facility] at [phone] with questions or concerns regarding your invoice.   Our billing staff will not be able to assist you with questions regarding bills from these companies.  You will be contacted with the lab results as soon as they are available. The fastest way to get your results is to activate your My Chart account. Instructions are located on the last page of this paperwork. If you have not heard from us regarding the results in 2 weeks, please contact this office.

## 2017-04-05 LAB — LIPID PANEL
CHOL/HDL RATIO: 3.8 ratio (ref 0.0–5.0)
Cholesterol, Total: 177 mg/dL (ref 100–199)
HDL: 47 mg/dL (ref >39)
LDL Calculated: 110 mg/dL — ABNORMAL HIGH (ref 0–99)
Triglycerides: 102 mg/dL (ref 0–149)
VLDL Cholesterol Cal: 20 mg/dL (ref 5–40)

## 2017-04-05 LAB — HEPATITIS B DNA, ULTRAQUANTITATIVE, PCR
HEPATITIS B DNA (CALC): 3.94 {Log_IU}/mL — AB
HEPATITIS B DNA: 8810 [IU]/mL — AB

## 2017-04-05 LAB — HEPATITIS B E ANTIBODY: HEPATITIS BE ANTIBODY: REACTIVE — AB

## 2017-04-05 LAB — HEPATITIS B E ANTIGEN: HEPATITIS BE ANTIGEN: NONREACTIVE

## 2017-04-06 ENCOUNTER — Encounter: Payer: Self-pay | Admitting: *Deleted

## 2017-04-13 ENCOUNTER — Encounter: Payer: Self-pay | Admitting: Physician Assistant

## 2017-04-13 ENCOUNTER — Ambulatory Visit (INDEPENDENT_AMBULATORY_CARE_PROVIDER_SITE_OTHER): Payer: BLUE CROSS/BLUE SHIELD | Admitting: Physician Assistant

## 2017-04-13 DIAGNOSIS — I10 Essential (primary) hypertension: Secondary | ICD-10-CM

## 2017-04-13 MED ORDER — LISINOPRIL-HYDROCHLOROTHIAZIDE 10-12.5 MG PO TABS: 1.0000 | ORAL_TABLET | Freq: Every day | ORAL | 0 refills | Status: DC

## 2017-04-13 NOTE — Progress Notes (Signed)
   Brian Preston  MRN: [id number] DOB: May 21, 1971  PCP: Morrell RiddleWeber, Talayla Doyel L, PA-C  Chief Complaint  Patient presents with  . Follow-up    patient started new medication    Subjective:  Pt presents to clinic for recheck BP since he started the new medications.  He feels much better.  This am his BP was 118/70 --  Review of Systems  Respiratory: Negative for cough and shortness of breath.   Cardiovascular: Negative for chest pain, palpitations and leg swelling.    Patient Active Problem List   Diagnosis Date Noted  . Essential hypertension 04/04/2017  . Elevated TSH 11/27/2016  . Chronic hepatitis B (HCC) 11/27/2016    No current outpatient prescriptions on file prior to visit.   No current facility-administered medications on file prior to visit.     No Known Allergies  Pt patients past, family and social history were reviewed and updated.   Objective:  BP 124/88   Pulse 88   Temp 97.9 F (36.6 C) (Oral)   Resp 16   Wt 136 lb 12.8 oz (62.1 kg)   SpO2 99%   BMI 24.23 kg/m   Physical Exam  Constitutional: He is oriented to person, place, and time and well-developed, well-nourished, and in no distress.  HENT:  Head: Normocephalic and atraumatic.  Right Ear: External ear normal.  Left Ear: External ear normal.  Eyes: Conjunctivae are normal.  Neck: Normal range of motion.  Cardiovascular: Normal rate, regular rhythm, normal heart sounds and intact distal pulses.   Pulmonary/Chest: Effort normal and breath sounds normal. He has no wheezes.  Musculoskeletal:       Right lower leg: He exhibits no edema.       Left lower leg: He exhibits no edema.  Neurological: He is alert and oriented to person, place, and time. Gait normal.  Skin: Skin is warm and dry.  Psychiatric: Mood, memory, affect and judgment normal.    Assessment and Plan :  Essential hypertension - Plan: lisinopril-hydrochlorothiazide (PRINZIDE,ZESTORETIC) 10-12.5 MG tablet - controlled - continue  medications - recheck with me in 3 months.  Benny LennertSarah Naylani Bradner PA-C  Primary Care at [facility] 04/13/2017 8:23 AM

## 2017-04-16 ENCOUNTER — Other Ambulatory Visit: Payer: BLUE CROSS/BLUE SHIELD

## 2017-04-27 ENCOUNTER — Ambulatory Visit
Admission: RE | Admit: 2017-04-27 | Discharge: 2017-04-27 | Disposition: A | Discharge status: Home / Self Care | Payer: BLUE CROSS/BLUE SHIELD | Source: Ambulatory Visit | Attending: Internal Medicine | Admitting: Internal Medicine

## 2017-04-27 DIAGNOSIS — B181 Chronic viral hepatitis B without delta-agent: Secondary | ICD-10-CM

## 2017-05-15 ENCOUNTER — Encounter: Payer: Self-pay | Admitting: Internal Medicine

## 2017-05-15 ENCOUNTER — Ambulatory Visit (INDEPENDENT_AMBULATORY_CARE_PROVIDER_SITE_OTHER): Payer: BLUE CROSS/BLUE SHIELD | Admitting: Internal Medicine

## 2017-05-15 VITALS — BP 124/80 | HR 83 | Temp 97.7°F | Ht 64.0 in | Wt 135.0 lb

## 2017-05-15 DIAGNOSIS — B181 Chronic viral hepatitis B without delta-agent: Secondary | ICD-10-CM | POA: Diagnosis not present

## 2017-05-15 DIAGNOSIS — K7400 Hepatic fibrosis, unspecified: Secondary | ICD-10-CM

## 2017-05-15 DIAGNOSIS — B1910 Unspecified viral hepatitis B without hepatic coma: Secondary | ICD-10-CM | POA: Insufficient documentation

## 2017-05-15 NOTE — Progress Notes (Signed)
   Subjective:    Patient ID: Brian Preston, male    DOB: Apr 03, 1971, [age] y.o.   MRN: [id number]  HPI Here for follow up of chronic hepatitis B He was new to me last visit with a history of above and never on treatment. 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  His last Hep B viral load was 713,000 2 years ago with E Ag negative disease.  When I saw him last time, his viral load was only 8,000 IU and had normal AST and ALT.  He is hepatitis A immune.  His elastography done last month is F0/1.  No complaints today.     Review of Systems  Constitutional: Negative for fatigue.  Gastrointestinal: Negative for diarrhea.  Skin: Negative for rash.  Neurological: Negative for dizziness.       Objective:   Physical Exam  Constitutional: He appears well-developed and well-nourished. No distress.  Eyes: No scleral icterus.  Cardiovascular: Normal rate, regular rhythm and normal heart sounds.   No murmur heard. Pulmonary/Chest: Effort normal and breath sounds normal. No respiratory distress.  Skin: No rash noted.   SH: occasional alcohol       Assessment & Plan:

## 2017-05-15 NOTE — Assessment & Plan Note (Signed)
F0/1 on elastography.  Will consider rechecking in 2-3 years

## 2017-05-15 NOTE — Assessment & Plan Note (Signed)
No indication at this time for treatment with no fibrosis, normal LFTs, viral load 8,000.  Will continue to monitor every 6 months

## 2017-05-22 ENCOUNTER — Ambulatory Visit
Admission: RE | Admit: 2017-05-22 | Discharge: 2017-05-22 | Disposition: A | Discharge status: Home / Self Care | Payer: BLUE CROSS/BLUE SHIELD | Source: Ambulatory Visit | Attending: Internal Medicine | Admitting: Internal Medicine

## 2017-05-22 DIAGNOSIS — B181 Chronic viral hepatitis B without delta-agent: Secondary | ICD-10-CM

## 2017-05-23 ENCOUNTER — Other Ambulatory Visit: Payer: Self-pay | Admitting: Internal Medicine

## 2017-05-23 DIAGNOSIS — B181 Chronic viral hepatitis B without delta-agent: Secondary | ICD-10-CM

## 2017-07-17 ENCOUNTER — Encounter: Payer: Self-pay | Admitting: Physician Assistant

## 2017-07-17 ENCOUNTER — Ambulatory Visit (INDEPENDENT_AMBULATORY_CARE_PROVIDER_SITE_OTHER): Payer: BLUE CROSS/BLUE SHIELD | Admitting: Physician Assistant

## 2017-07-17 VITALS — BP 117/82 | HR 76 | Temp 97.9°F | Resp 18 | Ht 64.0 in | Wt 136.4 lb

## 2017-07-17 DIAGNOSIS — I10 Essential (primary) hypertension: Secondary | ICD-10-CM

## 2017-07-17 DIAGNOSIS — Z23 Encounter for immunization: Secondary | ICD-10-CM | POA: Diagnosis not present

## 2017-07-17 DIAGNOSIS — E876 Hypokalemia: Secondary | ICD-10-CM

## 2017-07-17 LAB — BASIC METABOLIC PANEL
BUN/Creatinine Ratio: 19 (ref 9–20)
BUN: 17 mg/dL (ref 6–24)
CO2: 25 mmol/L (ref 20–29)
Calcium: 9.5 mg/dL (ref 8.7–10.2)
Chloride: 101 mmol/L (ref 96–106)
Creatinine, Ser: 0.9 mg/dL (ref 0.76–1.27)
GFR calc Af Amer: 119 mL/min/{1.73_m2} (ref >59)
GFR calc non Af Amer: 103 mL/min/{1.73_m2} (ref >59)
Glucose: 92 mg/dL (ref 65–99)
Potassium: 4.6 mmol/L (ref 3.5–5.2)
Sodium: 141 mmol/L (ref 134–144)

## 2017-07-17 MED ORDER — LISINOPRIL-HYDROCHLOROTHIAZIDE 10-12.5 MG PO TABS: 1.0000 | ORAL_TABLET | Freq: Every day | ORAL | 1 refills | Status: DC

## 2017-07-17 NOTE — Patient Instructions (Signed)
     IF you received an x-ray today, you will receive an invoice from [facility]. Please contact [facility] at [phone] with questions or concerns regarding your invoice.   IF you received labwork today, you will receive an invoice from [facility]. Please contact [facility] at [phone] with questions or concerns regarding your invoice.   Our billing staff will not be able to assist you with questions regarding bills from these companies.  You will be contacted with the lab results as soon as they are available. The fastest way to get your results is to activate your My Chart account. Instructions are located on the last page of this paperwork. If you have not heard from us regarding the results in 2 weeks, please contact this office.     

## 2017-07-17 NOTE — Progress Notes (Signed)
   Brian Preston  MRN: [id number] DOB: 07-Nov-1971  PCP: Morrell RiddleWeber, Sarah L, PA-C  Chief Complaint  Patient presents with  . Medication Refill    follow up on lisinopril     Subjective:  Pt presents to clinic for refill on his BP medications.  He does not check his BP at home.  He feels good and is having no problems with his medications.    At last labs in 5/18 his potassium as slightly low - he eats healthy.  History is obtained by patient.  Review of Systems  Constitutional: Negative for chills and fever.  Eyes: Negative for visual disturbance.  Respiratory: Negative for cough and shortness of breath.   Cardiovascular: Negative for chest pain, palpitations and leg swelling.  Neurological: Negative for dizziness, light-headedness and headaches.    Patient Active Problem List   Diagnosis Date Noted  . Liver fibrosis 05/15/2017  . Essential hypertension 04/04/2017  . Elevated TSH 11/27/2016  . Chronic hepatitis B (HCC) 11/27/2016    No current outpatient prescriptions on file prior to visit.   No current facility-administered medications on file prior to visit.     No Known Allergies  No past medical history on file. Social History   Social History Narrative   Lives with wife   Children - 2 - son and daughter   Social History  Substance Use Topics  . Smoking status: Former Smoker    Quit date: 11/22/1992  . Smokeless tobacco: Never Used  . Alcohol use No   family history includes Hypertension in his father and mother; Stroke (age of onset: 3663) in his mother.     Objective:  BP 117/82   Pulse 76   Temp 97.9 F (36.6 C) (Oral)   Resp 18   Ht 5\' 4"  (1.626 m)   Wt 136 lb 6.4 oz (61.9 kg)   SpO2 98%   BMI 23.41 kg/m  Body mass index is 23.41 kg/m.  Physical Exam  Constitutional: He is oriented to person, place, and time and well-developed, well-nourished, and in no distress.  HENT:  Head: Normocephalic and atraumatic.  Right Ear: External ear normal.    Left Ear: External ear normal.  Eyes: Conjunctivae are normal.  Neck: Normal range of motion.  Cardiovascular: Normal rate, regular rhythm, normal heart sounds and intact distal pulses.   Pulmonary/Chest: Effort normal and breath sounds normal. He has no wheezes.  Musculoskeletal:       Right lower leg: He exhibits no edema.       Left lower leg: He exhibits no edema.  Neurological: He is alert and oriented to person, place, and time. Gait normal.  Skin: Skin is warm and dry.  Psychiatric: Mood, memory, affect and judgment normal.    Assessment and Plan :  Essential hypertension - Plan: lisinopril-hydrochlorothiazide (PRINZIDE,ZESTORETIC) 10-12.5 MG tablet, Basic metabolic panel  Hypokalemia - Plan: Basic metabolic panel  Flu vaccine need - Plan: Flu Vaccine QUAD 36+ mos IM   Pt was instructed to continue taking daily BP medications as his BP is well controlled currently.  He will add potassium rich foods to his diet to help with his potassium - we will recheck again today but suspect food supplementation is all that he will need.  He will recheck in 6 months.  Benny LennertSarah Weber PA-C  [facility] at [facility] 07/17/2017 8:33 AM

## 2017-10-29 ENCOUNTER — Other Ambulatory Visit: Payer: BLUE CROSS/BLUE SHIELD

## 2017-10-29 DIAGNOSIS — B181 Chronic viral hepatitis B without delta-agent: Secondary | ICD-10-CM

## 2017-10-29 LAB — COMPLETE METABOLIC PANEL WITH GFR
AG Ratio: 1.3 (calc) (ref 1.0–2.5)
ALBUMIN MSPROF: 4.3 g/dL (ref 3.6–5.1)
ALKALINE PHOSPHATASE (APISO): 49 U/L (ref 40–115)
ALT: 57 U/L — AB (ref 9–46)
AST: 39 U/L (ref 10–40)
BUN: 19 mg/dL (ref 7–25)
CO2: 32 mmol/L (ref 20–32)
CREATININE: 0.86 mg/dL (ref 0.60–1.35)
Calcium: 9.7 mg/dL (ref 8.6–10.3)
Chloride: 101 mmol/L (ref 98–110)
GFR, Est African American: 121 mL/min/{1.73_m2} (ref >=60)
GFR, Est Non African American: 105 mL/min/{1.73_m2} (ref >=60)
GLUCOSE: 89 mg/dL (ref 65–99)
Globulin: 3.4 g/dL (calc) (ref 1.9–3.7)
Potassium: 5.2 mmol/L (ref 3.5–5.3)
Sodium: 138 mmol/L (ref 135–146)
TOTAL PROTEIN: 7.7 g/dL (ref 6.1–8.1)
Total Bilirubin: 0.5 mg/dL (ref 0.2–1.2)

## 2017-11-01 LAB — HEPATITIS B DNA, ULTRAQUANTITATIVE, PCR
Hepatitis B DNA (Calc): 4.98 Log IU/mL — ABNORMAL HIGH
Hepatitis B DNA: 94900 IU/mL — ABNORMAL HIGH

## 2017-11-20 ENCOUNTER — Other Ambulatory Visit: Payer: Self-pay

## 2017-11-20 ENCOUNTER — Encounter: Payer: Self-pay | Admitting: Internal Medicine

## 2017-11-20 ENCOUNTER — Telehealth: Payer: Self-pay | Admitting: *Deleted

## 2017-11-20 ENCOUNTER — Ambulatory Visit (INDEPENDENT_AMBULATORY_CARE_PROVIDER_SITE_OTHER): Payer: BLUE CROSS/BLUE SHIELD | Admitting: Internal Medicine

## 2017-11-20 VITALS — BP 149/97 | HR 84 | Temp 97.9°F | Ht 64.0 in | Wt 141.0 lb

## 2017-11-20 DIAGNOSIS — K7400 Hepatic fibrosis, unspecified: Secondary | ICD-10-CM

## 2017-11-20 DIAGNOSIS — Z7185 Encounter for immunization safety counseling: Secondary | ICD-10-CM | POA: Insufficient documentation

## 2017-11-20 DIAGNOSIS — Z7189 Other specified counseling: Secondary | ICD-10-CM | POA: Diagnosis not present

## 2017-11-20 DIAGNOSIS — B181 Chronic viral hepatitis B without delta-agent: Secondary | ICD-10-CM | POA: Diagnosis not present

## 2017-11-20 DIAGNOSIS — Z23 Encounter for immunization: Secondary | ICD-10-CM | POA: Diagnosis not present

## 2017-11-20 NOTE — Assessment & Plan Note (Signed)
Hep A #2, counseled

## 2017-11-20 NOTE — Progress Notes (Signed)
   Subjective:    Patient ID: Brian Preston, male    DOB: Oct 24, 1971, 47 y.o.   MRN: [id number]  HPI Here for follow up of chronic hepatitis B He was new to me last visit with a history of above and never on treatment. 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 He had labs and his viral load is 94,900 and ALT up to 57 and AST 39.  Previously was 24/20.  No new issues otherwise.     Review of Systems  Constitutional: Negative for fatigue.  Gastrointestinal: Negative for diarrhea.  Skin: Negative for rash.  Neurological: Negative for dizziness.       Objective:   Physical Exam  Constitutional: He appears well-developed and well-nourished. No distress.  Eyes: No scleral icterus.  Cardiovascular: Normal rate, regular rhythm and normal heart sounds.  No murmur heard. Pulmonary/Chest: Effort normal and breath sounds normal. No respiratory distress.  Skin: No rash noted.   SH: occasional alcohol       Assessment & Plan:

## 2017-11-20 NOTE — Assessment & Plan Note (Signed)
A little more inflammation with recent labs.  I will have him come back in 6 months and if still elevated, will consider starting tretament at that time.

## 2017-11-20 NOTE — Telephone Encounter (Signed)
Patient scheduled for abdominal ultrasound 1/11 at 8:00, [facility]. He will be NPO after midnight, [facility] will go to 1st floor Radiology. Patient present when appointment made, verbalized understanding and agreement. Andree CossHowell, Brennyn Haisley M, RN

## 2017-11-20 NOTE — Assessment & Plan Note (Signed)
Minimal fibrosis.  I will check HCC screening now with ultrasound

## 2017-11-23 ENCOUNTER — Ambulatory Visit (HOSPITAL_COMMUNITY)
Admission: RE | Admit: 2017-11-23 | Discharge: 2017-11-23 | Disposition: A | Discharge status: Home / Self Care | Payer: BLUE CROSS/BLUE SHIELD | Source: Ambulatory Visit | Attending: Internal Medicine | Admitting: Internal Medicine

## 2017-11-23 DIAGNOSIS — B181 Chronic viral hepatitis B without delta-agent: Secondary | ICD-10-CM | POA: Diagnosis not present

## 2017-11-30 IMAGING — US US ABDOMEN COMPLETE W/ ELASTOGRAPHY
1 series · 13 of 25 positions shown · non-contrast
Comparison: Right upper quadrant ultrasound dated 12/04/2016

CLINICAL DATA: Hepatitis B



[Series 1: us abdomen complete w/ elastography · 0.17mm/px · 13 of 25 slices shown]
[im 1/25]
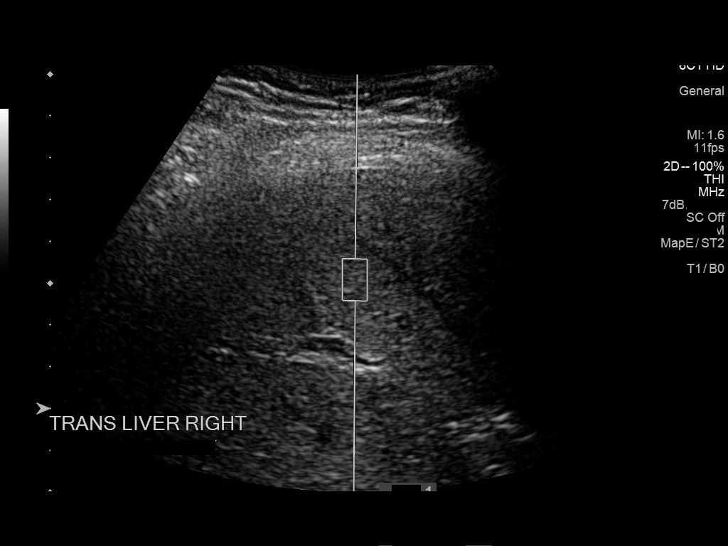
[im 3/25]
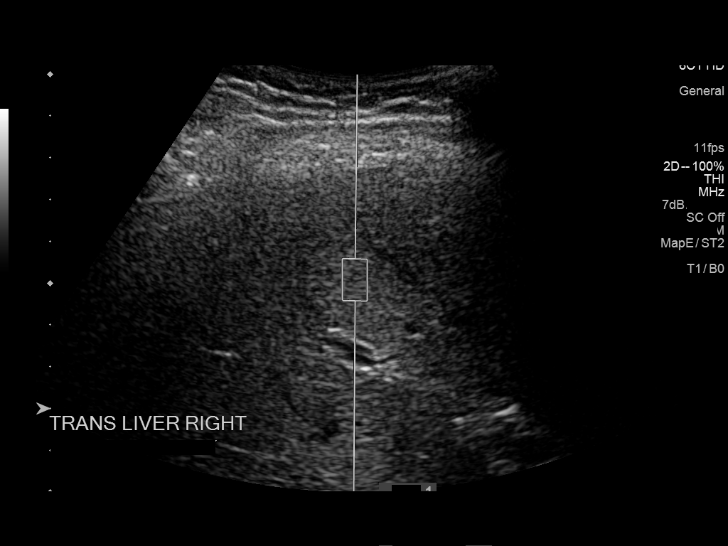
[im 5/25]
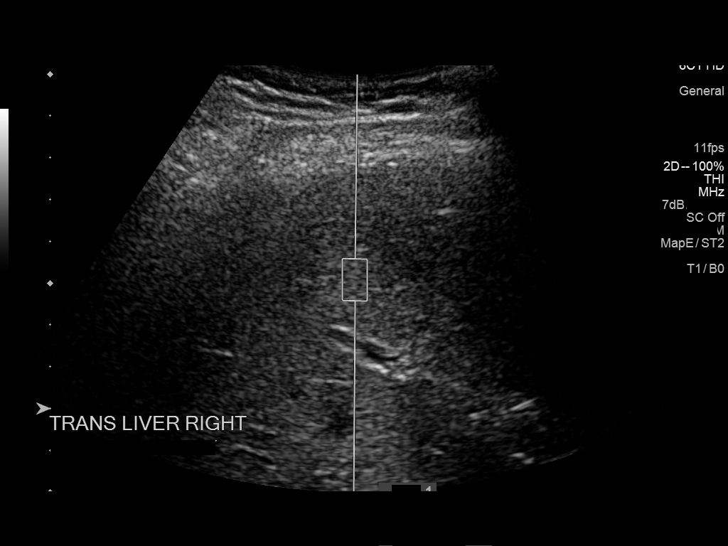
[im 7/25]
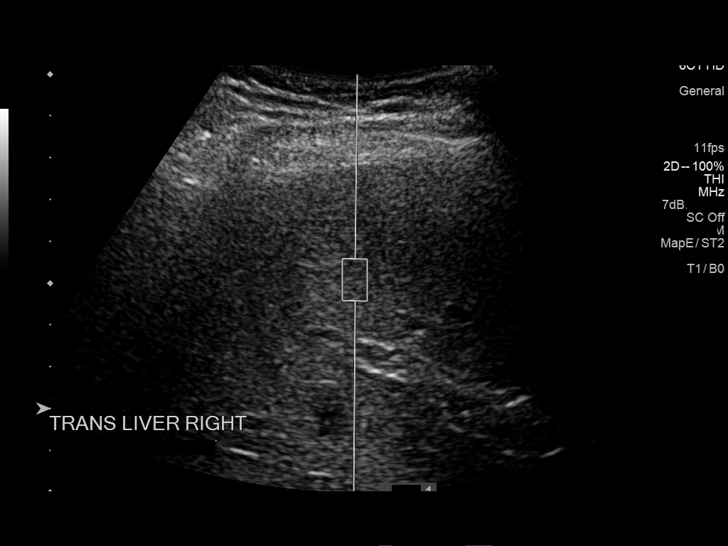
[im 9/25]
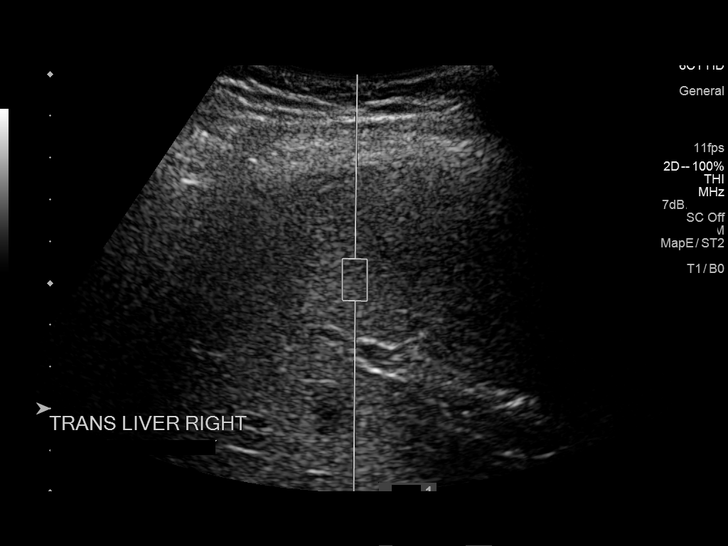
[im 11/25]
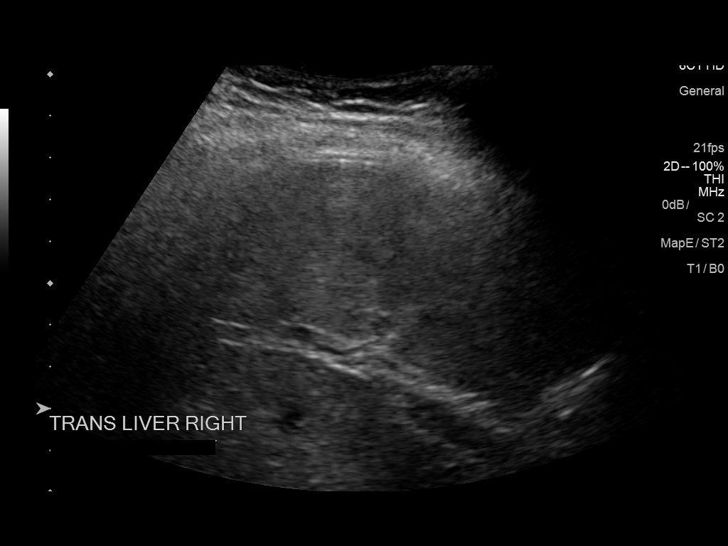
[im 13/25]
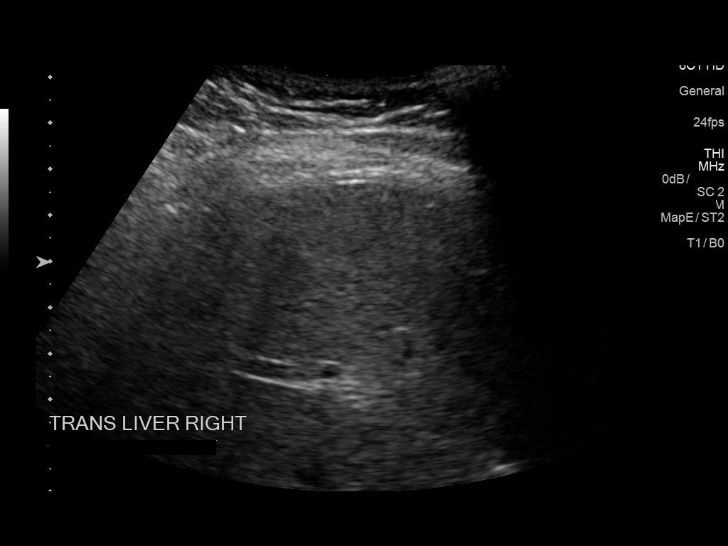
[im 15/25]
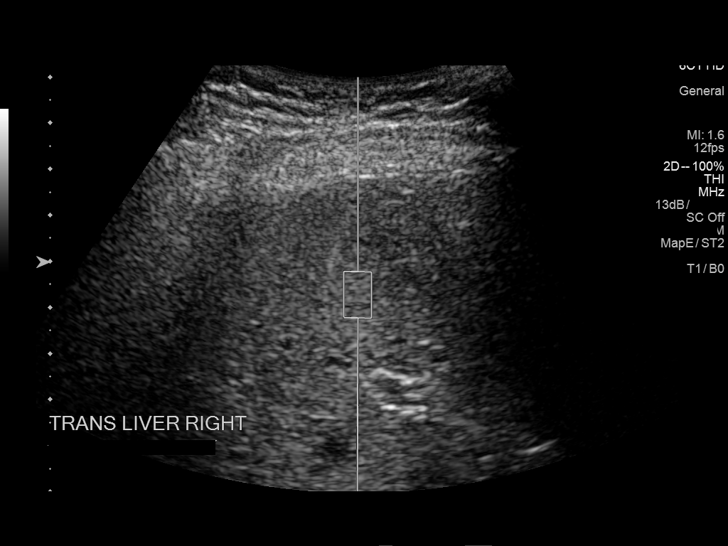
[im 17/25]
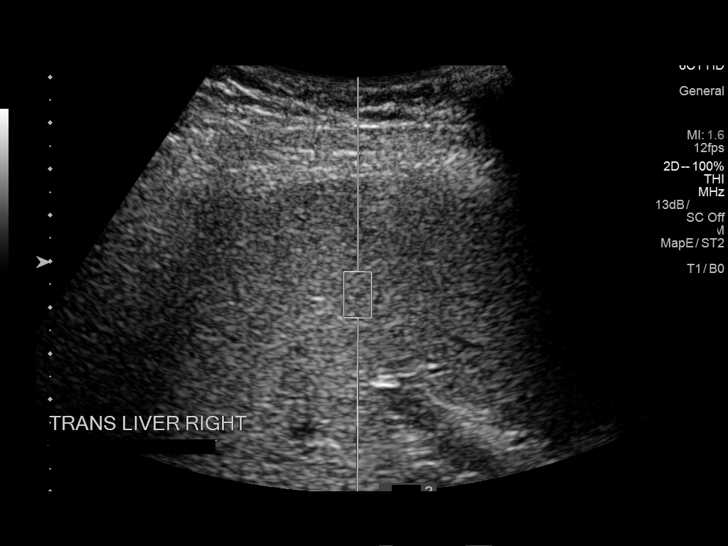
[im 19/25]
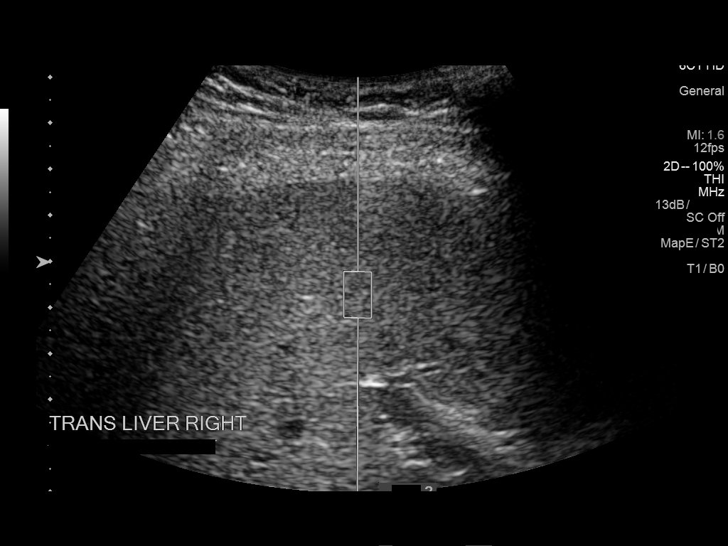
[im 21/25]
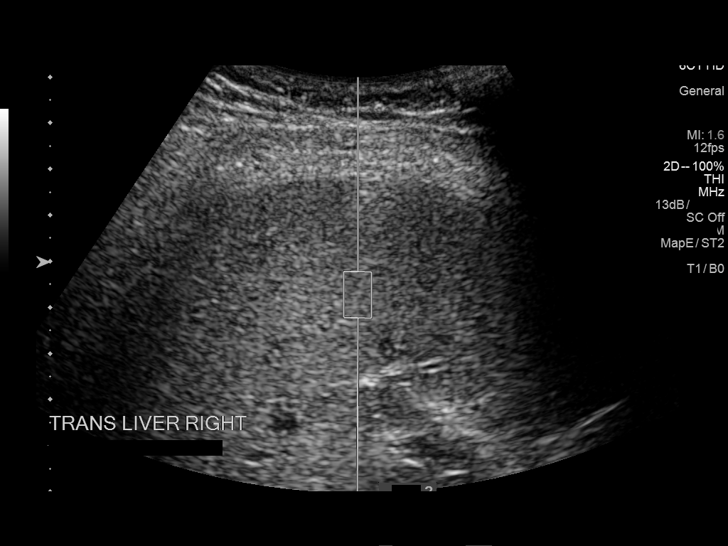
[im 23/25]
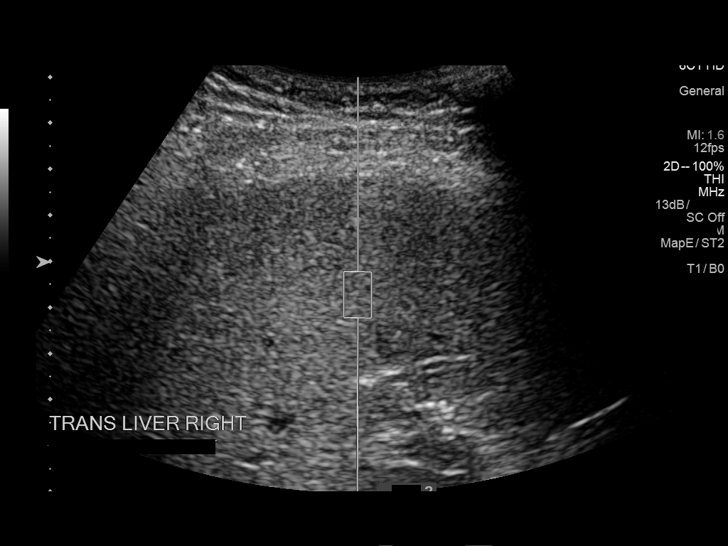
[im 25/25]
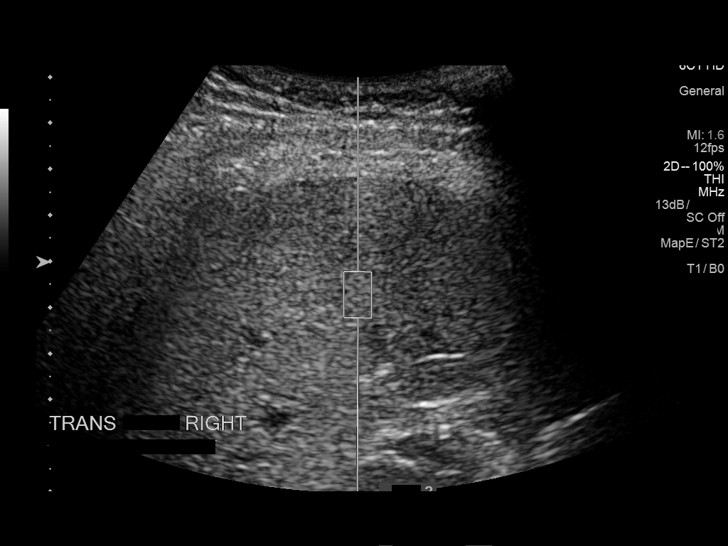

[13 of 25 positions shown; findings below may reference images not displayed]

FINDINGS: ULTRASOUND ABDOMEN

Gallbladder: No gallstones, gallbladder wall thickening, or
pericholecystic fluid.

Common bile duct: Diameter: 3 mm

Liver: Mildly nodular hepatic contour (image 49). Coarse hepatic
echotexture. No focal hepatic lesion is seen.

IVC: No abnormality visualized.

Pancreas: Incompletely visualized due to overlying bowel gas.

Spleen: Size and appearance within normal limits.

Right Kidney: Length: 10.5 cm.  No mass or hydronephrosis.

Left Kidney: Length: 11.2 cm.  No mass or hydronephrosis.

Abdominal aorta: No aneurysm visualized.

Other findings: Hepatopetal flow in the main portal vein.

ULTRASOUND HEPATIC ELASTOGRAPHY

Device: Siemens Helix VTQ

Patient position: Oblique

Transducer 6C1

Number of measurements: 10

Hepatic segment:  8

Median velocity:   1.15  m/sec

IQR:

IQR/Median velocity ratio:

Corresponding Metavir fibrosis score:  F0/F1

Risk of fibrosis: Minimal

Limitations of exam: None

Pertinent findings noted on other imaging exams:  None

Please note that abnormal shear wave velocities may also be
identified in clinical settings other than with hepatic fibrosis,
such as: acute hepatitis, elevated right heart and central venous
pressures including use of beta blockers, Brit disease
([place]), infiltrative processes such as
mastocytosis/amyloidosis/infiltrative tumor, extrahepatic
cholestasis, in the post-prandial state, and liver transplantation.
Correlation with patient history, laboratory data, and clinical
condition recommended.
IMPRESSION: ULTRASOUND ABDOMEN:
Mildly nodular hepatic contour with coarse hepatic echotexture,
nonspecific but raising the possibility of early/ mild cirrhosis. No
focal hepatic lesion is seen.

Hepatopetal flow in the main portal vein.

ULTRASOUND HEPATIC ELASTOGRAPHY:

Median hepatic shear wave velocity is calculated at 1.15 m/sec.

Corresponding Metavir fibrosis score is  F0/F1.

Risk of fibrosis is Minimal.

Follow-up: None required

## 2018-01-04 ENCOUNTER — Encounter: Payer: Self-pay | Admitting: Physician Assistant

## 2018-01-04 ENCOUNTER — Ambulatory Visit: Payer: BLUE CROSS/BLUE SHIELD | Admitting: Physician Assistant

## 2018-01-04 ENCOUNTER — Other Ambulatory Visit: Payer: Self-pay

## 2018-01-04 VITALS — BP 102/74 | HR 92 | Temp 98.1°F | Resp 18 | Ht 62.6 in | Wt 141.4 lb

## 2018-01-04 DIAGNOSIS — Z0000 Encounter for general adult medical examination without abnormal findings: Secondary | ICD-10-CM | POA: Diagnosis not present

## 2018-01-04 DIAGNOSIS — R7989 Other specified abnormal findings of blood chemistry: Secondary | ICD-10-CM | POA: Diagnosis not present

## 2018-01-04 DIAGNOSIS — Z13220 Encounter for screening for lipoid disorders: Secondary | ICD-10-CM | POA: Diagnosis not present

## 2018-01-04 DIAGNOSIS — Z131 Encounter for screening for diabetes mellitus: Secondary | ICD-10-CM | POA: Diagnosis not present

## 2018-01-04 DIAGNOSIS — Z1389 Encounter for screening for other disorder: Secondary | ICD-10-CM | POA: Diagnosis not present

## 2018-01-04 DIAGNOSIS — B181 Chronic viral hepatitis B without delta-agent: Secondary | ICD-10-CM

## 2018-01-04 DIAGNOSIS — E7800 Pure hypercholesterolemia, unspecified: Secondary | ICD-10-CM | POA: Diagnosis not present

## 2018-01-04 DIAGNOSIS — Z130 Encounter for screening for diseases of the blood and blood-forming organs and certain disorders involving the immune mechanism: Secondary | ICD-10-CM | POA: Diagnosis not present

## 2018-01-04 DIAGNOSIS — K921 Melena: Secondary | ICD-10-CM

## 2018-01-04 DIAGNOSIS — I10 Essential (primary) hypertension: Secondary | ICD-10-CM

## 2018-01-04 LAB — POC HEMOCCULT BLD/STL (OFFICE/1-CARD/DIAGNOSTIC): FECAL OCCULT BLD: NEGATIVE

## 2018-01-04 MED ORDER — LISINOPRIL-HYDROCHLOROTHIAZIDE 10-12.5 MG PO TABS: 1.0000 | ORAL_TABLET | Freq: Every day | ORAL | 1 refills | Status: DC

## 2018-01-04 NOTE — Progress Notes (Signed)
Brian Preston  MRN: [id number] DOB: February 06, 1971  PCP: Brian Bale, PA-C  Subjective:  Pt presents to clinic for a CPE.  Over the last 2 weeks - he has noticed that when he drives beer he will have bloody stools the next day.  He has not had blood without beer.  No change in stool otherwise - normal consistently and pattern.  Blood on toilet tissue as well - bright red blood.  No abd pain. No ASA use.  No NSAID use.  Last dental exam: once a year Last vision exam: 2 months ago - reading glasses  Vaccinations - UTD  Typical meals for patient: 3x/day, homecooked Typical beverage choices: water and OJ Exercises: 7 times per week for 2 hours - run on treadmil and light weights Sleeps: 7 hrs per night and sleeping well   Patient Active Problem List   Diagnosis Date Noted  . Liver fibrosis 05/15/2017  . Essential hypertension 04/04/2017  . Elevated TSH 11/27/2016  . Chronic hepatitis B ([place]) 11/27/2016    Review of Systems  Constitutional: Negative.   HENT: Negative.   Eyes: Negative.   Respiratory: Negative.   Cardiovascular: Negative.   Gastrointestinal: Positive for blood in stool.  Endocrine: Negative.   Genitourinary: Negative.   Musculoskeletal: Negative.   Skin: Negative.   Allergic/Immunologic: Negative.   Neurological: Negative.   Hematological: Negative.   Psychiatric/Behavioral: Negative.      No current outpatient medications on file prior to visit.   No current facility-administered medications on file prior to visit.     No Known Allergies  Social History   Socioeconomic History  . Marital status: Married    Spouse name: Brian Preston  . Number of children: 2  . Years of education: None  . Highest education level: None  Social Needs  . Financial resource strain: None  . Food insecurity - worry: None  . Food insecurity - inability: None  . Transportation needs - medical: None  . Transportation needs - non-medical: None  Occupational History  . None    Tobacco Use  . Smoking status: Former Smoker    Last attempt to quit: 11/22/1992    Years since quitting: 25.1  . Smokeless tobacco: Never Used  Substance and Sexual Activity  . Alcohol use: No    Alcohol/week: 0.6 oz    Types: 1 Cans of beer per week  . Drug use: No  . Sexual activity: Yes    Partners: Female  Other Topics Concern  . None  Social History Narrative   From [place] to [place] 1994   Lives with wife   Children - 2 - son and daughter    History reviewed. No pertinent surgical history.  Family History  Problem Relation Age of Onset  . Hypertension Mother   . Stroke Mother 80  . Hypertension Father      Objective:  BP 102/74   Pulse 92   Temp 98.1 F (36.7 C) (Oral)   Resp 18   Ht 5' 2.6" (1.59 m)   Wt 141 lb 6.4 oz (64.1 kg)   SpO2 99%   BMI 25.37 kg/m   Physical Exam  Constitutional: He is oriented to person, place, and time and well-developed, well-nourished, and in no distress.  HENT:  Head: Normocephalic and atraumatic.  Right Ear: Hearing, tympanic membrane, external ear and ear canal normal.  Left Ear: Hearing, tympanic membrane, external ear and ear canal normal.  Nose: Nose normal.  Mouth/Throat: Uvula is midline,  oropharynx is clear and moist and mucous membranes are normal.  Eyes: Conjunctivae and EOM are normal. Pupils are equal, round, and reactive to light.  Neck: Trachea normal and normal range of motion. Neck supple. No thyroid mass and no thyromegaly present.  Cardiovascular: Normal rate, regular rhythm and normal heart sounds.  No murmur heard. Pulmonary/Chest: Effort normal and breath sounds normal.  Abdominal: Soft. Bowel sounds are normal.  Genitourinary: Prostate normal. Rectal exam shows no external hemorrhoid, no internal hemorrhoid, no tenderness and guaiac negative stool.  Musculoskeletal: Normal range of motion.  Neurological: He is alert and oriented to person, place, and time. Gait normal.  Skin: Skin is warm and dry.   Psychiatric: Mood, memory, affect and judgment normal.    Wt Readings from Last 3 Encounters:  01/04/18 141 lb 6.4 oz (64.1 kg)  11/20/17 141 lb (64 kg)  07/17/17 136 lb 6.4 oz (61.9 kg)     Visual Acuity Screening   Right eye Left eye Both eyes  Without correction: 20/25-1 20/20-2 20/20-2  With correction:      Results for orders placed or performed in visit on 01/04/18  POC Hemoccult Bld/Stl (1-Cd Office Dx)  Result Value Ref Range   Card #1 Date     Fecal Occult Blood, POC Negative Negative    Assessment and Plan :  Annual physical exam  Blood in stool - Plan: POC Hemoccult Bld/Stl (1-Cd Office Dx) - ? Cause - no blood today on exam and no hemorrhoid seen or palpated -- ? Relationship to ETOH - he will abstain from ETOH for 2-3 weeks to allow likely internal hemorrhoid to heal and then if it happens again he will recheck with me and then likely a referral will need to be done   Screening for blood or protein in urine - Plan: Urinalysis, dipstick only - check labs  Screening, anemia, deficiency, iron - Plan: CBC with Differential/Platelet - check labs  Screening for diabetes mellitus - Plan: Hemoglobin A1c - check labs  Essential hypertension - Plan: CMP14+EGFR, lisinopril-hydrochlorothiazide (PRINZIDE,ZESTORETIC) 10-12.5 MG tablet - well controlled - continue medication  Chronic hepatitis B ([place]) - Plan: CMP14+EGFR  Elevated TSH - Plan: TSH - was elevated in the past but last check was normal - will recheck today  Screening, lipid - Plan: Lipid panel  Elevated LDL cholesterol level - Plan: Lipid panel  Windell Hummingbird PA-C  Primary Care at [facility] 01/04/2018 12:32 PM

## 2018-01-04 NOTE — Patient Instructions (Signed)
     IF you received an x-ray today, you will receive an invoice from [facility]. Please contact [facility] at [phone] with questions or concerns regarding your invoice.   IF you received labwork today, you will receive an invoice from [facility]. Please contact [facility] at [phone] with questions or concerns regarding your invoice.   Our billing staff will not be able to assist you with questions regarding bills from these companies.  You will be contacted with the lab results as soon as they are available. The fastest way to get your results is to activate your My Chart account. Instructions are located on the last page of this paperwork. If you have not heard from us regarding the results in 2 weeks, please contact this office.     

## 2018-01-05 LAB — CMP14+EGFR
ALBUMIN: 4.1 g/dL (ref 3.5–5.5)
ALK PHOS: 48 IU/L (ref 39–117)
ALT: 27 IU/L (ref 0–44)
AST: 17 IU/L (ref 0–40)
Albumin/Globulin Ratio: 1.3 (ref 1.2–2.2)
BILIRUBIN TOTAL: 0.3 mg/dL (ref 0.0–1.2)
BUN / CREAT RATIO: 18 (ref 9–20)
BUN: 15 mg/dL (ref 6–24)
CHLORIDE: 103 mmol/L (ref 96–106)
CO2: 21 mmol/L (ref 20–29)
Calcium: 9.2 mg/dL (ref 8.7–10.2)
Creatinine, Ser: 0.84 mg/dL (ref 0.76–1.27)
GFR calc Af Amer: 122 mL/min/{1.73_m2} (ref >59)
GFR calc non Af Amer: 106 mL/min/{1.73_m2} (ref >59)
GLOBULIN, TOTAL: 3.2 g/dL (ref 1.5–4.5)
Glucose: 82 mg/dL (ref 65–99)
Potassium: 4.5 mmol/L (ref 3.5–5.2)
SODIUM: 141 mmol/L (ref 134–144)
Total Protein: 7.3 g/dL (ref 6.0–8.5)

## 2018-01-05 LAB — TSH: TSH: 2.48 u[IU]/mL (ref 0.450–4.500)

## 2018-01-05 LAB — URINALYSIS, DIPSTICK ONLY
Bilirubin, UA: NEGATIVE
Glucose, UA: NEGATIVE
Ketones, UA: NEGATIVE
LEUKOCYTES UA: NEGATIVE
NITRITE UA: NEGATIVE
PH UA: 5 (ref 5.0–7.5)
Specific Gravity, UA: 1.021 (ref 1.005–1.030)
UUROB: 0.2 mg/dL (ref 0.2–1.0)

## 2018-01-05 LAB — CBC WITH DIFFERENTIAL/PLATELET
BASOS ABS: 0 10*3/uL (ref 0.0–0.2)
Basos: 0 %
EOS (ABSOLUTE): 0.6 10*3/uL — ABNORMAL HIGH (ref 0.0–0.4)
EOS: 7 %
HEMOGLOBIN: 15.2 g/dL (ref 13.0–17.7)
Hematocrit: 46.2 % (ref 37.5–51.0)
Immature Grans (Abs): 0 10*3/uL (ref 0.0–0.1)
Immature Granulocytes: 0 %
Lymphocytes Absolute: 2 10*3/uL (ref 0.7–3.1)
Lymphs: 27 %
MCH: 29.5 pg (ref 26.6–33.0)
MCHC: 32.9 g/dL (ref 31.5–35.7)
MCV: 90 fL (ref 79–97)
MONOS ABS: 0.5 10*3/uL (ref 0.1–0.9)
Monocytes: 7 %
NEUTROS ABS: 4.4 10*3/uL (ref 1.4–7.0)
Neutrophils: 59 %
Platelets: 301 10*3/uL (ref 150–379)
RBC: 5.16 x10E6/uL (ref 4.14–5.80)
RDW: 14.4 % (ref 12.3–15.4)
WBC: 7.6 10*3/uL (ref 3.4–10.8)

## 2018-01-05 LAB — LIPID PANEL
CHOLESTEROL TOTAL: 166 mg/dL (ref 100–199)
Chol/HDL Ratio: 4.2 ratio (ref 0.0–5.0)
HDL: 40 mg/dL (ref >39)
LDL Calculated: 103 mg/dL — ABNORMAL HIGH (ref 0–99)
TRIGLYCERIDES: 113 mg/dL (ref 0–149)
VLDL CHOLESTEROL CAL: 23 mg/dL (ref 5–40)

## 2018-01-05 LAB — HEMOGLOBIN A1C
Est. average glucose Bld gHb Est-mCnc: 111 mg/dL
Hgb A1c MFr Bld: 5.5 % (ref 4.8–5.6)

## 2018-05-20 ENCOUNTER — Other Ambulatory Visit: Payer: BLUE CROSS/BLUE SHIELD

## 2018-05-27 ENCOUNTER — Ambulatory Visit (INDEPENDENT_AMBULATORY_CARE_PROVIDER_SITE_OTHER): Payer: BLUE CROSS/BLUE SHIELD | Admitting: Internal Medicine

## 2018-05-27 ENCOUNTER — Encounter: Payer: Self-pay | Admitting: Internal Medicine

## 2018-05-27 VITALS — BP 140/100 | HR 80 | Temp 97.8°F | Ht 64.0 in | Wt 141.0 lb

## 2018-05-27 DIAGNOSIS — Z23 Encounter for immunization: Secondary | ICD-10-CM | POA: Diagnosis not present

## 2018-05-27 DIAGNOSIS — K7400 Hepatic fibrosis, unspecified: Secondary | ICD-10-CM

## 2018-05-27 DIAGNOSIS — Z7185 Encounter for immunization safety counseling: Secondary | ICD-10-CM

## 2018-05-27 DIAGNOSIS — B181 Chronic viral hepatitis B without delta-agent: Secondary | ICD-10-CM

## 2018-05-27 DIAGNOSIS — Z7189 Other specified counseling: Secondary | ICD-10-CM | POA: Diagnosis not present

## 2018-05-27 LAB — COMPLETE METABOLIC PANEL WITH GFR
AG RATIO: 1.3 (calc) (ref 1.0–2.5)
ALBUMIN MSPROF: 4.3 g/dL (ref 3.6–5.1)
ALT: 21 U/L (ref 9–46)
AST: 16 U/L (ref 10–40)
Alkaline phosphatase (APISO): 47 U/L (ref 40–115)
BUN: 21 mg/dL (ref 7–25)
CALCIUM: 9.6 mg/dL (ref 8.6–10.3)
CO2: 30 mmol/L (ref 20–32)
Chloride: 103 mmol/L (ref 98–110)
Creat: 1 mg/dL (ref 0.60–1.35)
GFR, EST AFRICAN AMERICAN: 104 mL/min/{1.73_m2} (ref >=60)
GFR, EST NON AFRICAN AMERICAN: 90 mL/min/{1.73_m2} (ref >=60)
Globulin: 3.3 g/dL (calc) (ref 1.9–3.7)
Glucose, Bld: 95 mg/dL (ref 65–99)
POTASSIUM: 5.3 mmol/L (ref 3.5–5.3)
Sodium: 138 mmol/L (ref 135–146)
TOTAL PROTEIN: 7.6 g/dL (ref 6.1–8.1)
Total Bilirubin: 0.4 mg/dL (ref 0.2–1.2)

## 2018-05-27 NOTE — Assessment & Plan Note (Signed)
I will check his labs today and with recent inflammation of his LFTs, may consider treatment if it is up again.  Viral load has been high.   I will have him return in about 6-7 weeks after labs and MRI

## 2018-05-27 NOTE — Progress Notes (Signed)
   Subjective:    Patient ID: Brian Preston, male    DOB: 11-25-1970, 47 y.o.   MRN: [id number]  HPI Here for follow up of chronic hepatitis C.  Has been observed off of treatment with an elevatedHBV DNA 94,900 last checked and he did have some elevated transaminases up to 39/57 but then back down to 17/27 last check.  Elastography was F0/1 though the ultrasound suggested some possible early cirrhosis.  Follow up ultrasound though did not confirm that.  No new issues.  No associated fatigue.     Review of Systems  Gastrointestinal: Negative for diarrhea.  Skin: Negative for rash.       Objective:   Physical Exam  Constitutional: He appears well-developed and well-nourished. No distress.  HENT:  Mouth/Throat: No oropharyngeal exudate.  Cardiovascular: Normal rate, regular rhythm and normal heart sounds.  No murmur heard. Pulmonary/Chest: Effort normal and breath sounds normal. No respiratory distress.  Skin: No rash noted.   SH: no alcohol       Assessment & Plan:

## 2018-05-27 NOTE — Assessment & Plan Note (Signed)
Will give pneumovax today

## 2018-05-27 NOTE — Assessment & Plan Note (Signed)
Equivocal on ultrasounds.  I will check an MRI for a better idea of cirrhosis or not and [facility]

## 2018-05-29 LAB — HEPATITIS B DNA, ULTRAQUANTITATIVE, PCR
HEPATITIS B DNA (CALC): 4.28 {Log_IU}/mL — AB
HEPATITIS B DNA: 19200 [IU]/mL — AB

## 2018-06-28 ENCOUNTER — Ambulatory Visit (HOSPITAL_COMMUNITY)
Admission: RE | Admit: 2018-06-28 | Discharge: 2018-06-28 | Disposition: A | Discharge status: Home / Self Care | Payer: BLUE CROSS/BLUE SHIELD | Source: Ambulatory Visit | Attending: Internal Medicine | Admitting: Internal Medicine

## 2018-06-28 DIAGNOSIS — B181 Chronic viral hepatitis B without delta-agent: Secondary | ICD-10-CM

## 2018-07-22 ENCOUNTER — Other Ambulatory Visit: Payer: Self-pay

## 2018-07-22 ENCOUNTER — Encounter: Payer: Self-pay | Admitting: Emergency Medicine

## 2018-07-22 ENCOUNTER — Ambulatory Visit: Payer: BLUE CROSS/BLUE SHIELD | Admitting: Emergency Medicine

## 2018-07-22 ENCOUNTER — Ambulatory Visit (INDEPENDENT_AMBULATORY_CARE_PROVIDER_SITE_OTHER): Payer: BLUE CROSS/BLUE SHIELD

## 2018-07-22 VITALS — BP 118/79 | HR 98 | Temp 98.7°F | Resp 16 | Ht 62.25 in | Wt 139.0 lb

## 2018-07-22 DIAGNOSIS — M25562 Pain in left knee: Secondary | ICD-10-CM

## 2018-07-22 DIAGNOSIS — S83412A Sprain of medial collateral ligament of left knee, initial encounter: Secondary | ICD-10-CM | POA: Insufficient documentation

## 2018-07-22 DIAGNOSIS — Z23 Encounter for immunization: Secondary | ICD-10-CM

## 2018-07-22 MED ORDER — DICLOFENAC SODIUM 75 MG PO TBEC: 75.0000 mg | DELAYED_RELEASE_TABLET | Freq: Two times a day (BID) | ORAL | 0 refills | Status: AC

## 2018-07-22 NOTE — Patient Instructions (Addendum)
If you have lab work done today you will be contacted with your lab results within the next 2 weeks.  If you have not heard from [place] then please contact us. The fastest way to get your results is to register for My Chart.   IF you received an x-ray today, you will receive an invoice from [facility]. Please contact [facility] at [phone] with questions or concerns regarding your invoice.   IF you received labwork today, you will receive an invoice from [facility]. Please contact [facility] at [phone] with questions or concerns regarding your invoice.   Our billing staff will not be able to assist you with questions regarding bills from these companies.  You will be contacted with the lab results as soon as they are available. The fastest way to get your results is to activate your My Chart account. Instructions are located on the last page of this paperwork. If you have not heard from [place] regarding the results in 2 weeks, please contact this office.     Knee Sprain A knee sprain is a stretch or tear in a knee ligament. Knee ligaments are bands of tissue that connect bones in the knee to each other. Follow these instructions at home: If you have a splint or brace:  Wear the splint or brace as told by your doctor. Remove it only as told by your doctor.  Loosen the splint or brace if your toes tingle, get numb, or turn cold and blue.  Keep the splint or brace clean.  If the splint or brace is not waterproof: ? Do not let it get wet. ? Cover it with a watertight covering when you take a bath or a shower. If you have a cast:  Do not stick anything inside the cast to scratch your skin.  Check the skin around the cast every day. Tell your doctor about any concerns.  You may put lotion on dry skin around the edges of the cast. Do not put lotion on the skin underneath the cast.  Keep the cast clean.  If the cast is not waterproof: ? Do not let it get  wet. ? Cover it with a watertight covering when you take a bath or a shower. Managing pain, stiffness, and swelling  Gently move your toes often to avoid stiffness and to lessen swelling.  Raise (elevate) the injured area above the level of your heart while you are sitting or lying down.  Take over-the-counter and prescription medicines only as told by your doctor.  If directed, put ice on the injured area. ? If you have a removable splint or brace, remove it as told by your doctor. ? Put ice in a plastic bag. ? Place a towel between your skin and the bag or between your cast and the bag. ? Leave the ice on for 20 minutes, 2-3 times a day. General instructions  Do exercises as told by your doctor.  Keep all follow-up visits as told by your doctor. This is important. Contact a doctor if:  You have pain that gets worse.  The cast, brace, or splint does not fit right.  The cast, brace, or splint gets damaged. Get help right away if:  You cannot lean on your knee to stand or walk.  You cannot move the injured area.  You knee buckles or you have pain after you walk only a few steps.  You have very bad pain, swelling, or numbness below the cast,  brace, or splint. Summary  A knee sprain is a stretch or tear in a band (ligament) that connects your knee bones to each other.  You may need to wear a splint, brace, or cast to help your knee get better.  Contact your doctor if you have very bad pain, swelling, or numbness, or if you cannot walk. This information is not intended to replace advice given to you by your health care provider. Make sure you discuss any questions you have with your health care provider. Document Released: 10/18/2009 Document Revised: 07/18/2016 Document Reviewed: 07/18/2016 Elsevier Interactive Patient Education  2017 [employer].

## 2018-07-22 NOTE — Progress Notes (Signed)
Brian Preston 47 y.o.   Chief Complaint  Patient presents with  . Knee Injury    LEFT-  fell while playing yesterday  . Fall    playing with friend    HISTORY OF PRESENT ILLNESS: This is a 47 y.o. male complaining of injury to the left knee sustained yesterday.  Complaining of pain to the medial aspect.  Pain is constant and sharp with no radiation.  Worse with movement and better with rest and keeping the leg straight.  No associated symptoms.  HPI   Prior to Admission medications   Medication Sig Start Date End Date Taking? Authorizing Provider  lisinopril-hydrochlorothiazide (PRINZIDE,ZESTORETIC) 10-12.5 MG tablet Take 1 tablet by mouth daily. 01/04/18  Yes Weber, Dema Severin, PA-C    No Known Allergies  Patient Active Problem List   Diagnosis Date Noted  . Vaccine counseling 11/20/2017  . Liver fibrosis 05/15/2017  . Essential hypertension 04/04/2017  . Elevated TSH 11/27/2016  . Chronic hepatitis B (HCC) 11/27/2016    No past medical history on file.  No past surgical history on file.  Social History   Socioeconomic History  . Marital status: Married    Spouse name: Tristan Schroeder  . Number of children: 2  . Years of education: Not on file  . Highest education level: Not on file  Occupational History  . Not on file  Social Needs  . Financial resource strain: Not on file  . Food insecurity:    Worry: Not on file    Inability: Not on file  . Transportation needs:    Medical: Not on file    Non-medical: Not on file  Tobacco Use  . Smoking status: Former Smoker    Last attempt to quit: 11/22/1992    Years since quitting: 25.6  . Smokeless tobacco: Never Used  Substance and Sexual Activity  . Alcohol use: No    Alcohol/week: 1.0 standard drinks    Types: 1 Cans of beer per week  . Drug use: No  . Sexual activity: Yes    Partners: Female  Lifestyle  . Physical activity:    Days per week: Not on file    Minutes per session: Not on file  . Stress: Not on file    Relationships  . Social connections:    Talks on phone: Not on file    Gets together: Not on file    Attends religious service: Not on file    Active member of club or organization: Not on file    Attends meetings of clubs or organizations: Not on file    Relationship status: Not on file  . Intimate partner violence:    Fear of current or ex partner: Not on file    Emotionally abused: Not on file    Physically abused: Not on file    Forced sexual activity: Not on file  Other Topics Concern  . Not on file  Social History Narrative   From [place] to [place] 1994   Lives with wife   Children - 2 - son and daughter    Family History  Problem Relation Age of Onset  . Hypertension Mother   . Stroke Mother 33  . Hypertension Father      Review of Systems  Constitutional: Negative.  Negative for chills and fever.  Respiratory: Negative for cough and shortness of breath.   Cardiovascular: Negative for chest pain and palpitations.  Gastrointestinal: Negative for nausea and vomiting.  Skin: Negative.  Negative for rash.  Neurological: Negative for dizziness and headaches.  Endo/Heme/Allergies: Negative.   All other systems reviewed and are negative.  Vitals:   07/22/18 0945  BP: 118/79  Pulse: 98  Resp: 16  Temp: 98.7 F (37.1 C)  SpO2: 97%     Physical Exam  Constitutional: He is oriented to person, place, and time. He appears well-developed and well-nourished.  HENT:  Head: Normocephalic and atraumatic.  Eyes: Pupils are equal, round, and reactive to light. EOM are normal.  Neck: Normal range of motion. Neck supple.  Cardiovascular: Normal rate and regular rhythm.  Pulmonary/Chest: Effort normal and breath sounds normal.  Musculoskeletal:  Left knee: No erythema or bruising.  Positive tenderness to medial aspect.  Limited range of motion due to pain.  Stable in flexion and extension.  Neurological: He is alert and oriented to person, place, and time. No sensory deficit.  He exhibits normal muscle tone.  Skin: Skin is warm and dry. Capillary refill takes less than 2 seconds.  Psychiatric: He has a normal mood and affect. His behavior is normal.  Vitals reviewed.   Dg Knee Complete 4 Views Left  Result Date: 07/22/2018 CLINICAL DATA:  Acute left knee pain, no injury. EXAM: LEFT KNEE - COMPLETE 4+ VIEW COMPARISON:  None. FINDINGS: No joint effusion.  Osseous structures are unremarkable. IMPRESSION: Negative. Electronically Signed   By: Leanna Battles M.D.   On: 07/22/2018 10:12   A total of 25 minutes was spent in the room with the patient, greater than 50% of which was in counseling/coordination of care regarding diagnosis, treatment, medications, x-ray reviewed, and need for follow-up if no better or worse in a week.  ASSESSMENT & PLAN: [name] was seen today for knee injury and fall.  Diagnoses and all orders for this visit:  Acute pain of left knee -     DG Knee Complete 4 Views Left; Future -     diclofenac (VOLTAREN) 75 MG EC tablet; Take 1 tablet (75 mg total) by mouth 2 (two) times daily for 5 days. After 5 days take as needed.  Sprain of medial collateral ligament of left knee, initial encounter  Need for prophylactic vaccination and inoculation against influenza -     Flu Vaccine QUAD 36+ mos IM     Patient Instructions       If you have lab work done today you will be contacted with your lab results within the next 2 weeks.  If you have not heard from [place] then please contact us. The fastest way to get your results is to register for My Chart.   IF you received an x-ray today, you will receive an invoice from [facility]. Please contact [facility] at [phone] with questions or concerns regarding your invoice.   IF you received labwork today, you will receive an invoice from [facility]. Please contact [facility] at [phone] with questions or concerns regarding your invoice.   Our billing staff will not be able to  assist you with questions regarding bills from these companies.  You will be contacted with the lab results as soon as they are available. The fastest way to get your results is to activate your My Chart account. Instructions are located on the last page of this paperwork. If you have not heard from [place] regarding the results in 2 weeks, please contact this office.     Knee Sprain A knee sprain is a stretch or tear in a knee ligament. Knee ligaments are bands of tissue that  connect bones in the knee to each other. Follow these instructions at home: If you have a splint or brace:  Wear the splint or brace as told by your doctor. Remove it only as told by your doctor.  Loosen the splint or brace if your toes tingle, get numb, or turn cold and blue.  Keep the splint or brace clean.  If the splint or brace is not waterproof: ? Do not let it get wet. ? Cover it with a watertight covering when you take a bath or a shower. If you have a cast:  Do not stick anything inside the cast to scratch your skin.  Check the skin around the cast every day. Tell your doctor about any concerns.  You may put lotion on dry skin around the edges of the cast. Do not put lotion on the skin underneath the cast.  Keep the cast clean.  If the cast is not waterproof: ? Do not let it get wet. ? Cover it with a watertight covering when you take a bath or a shower. Managing pain, stiffness, and swelling  Gently move your toes often to avoid stiffness and to lessen swelling.  Raise (elevate) the injured area above the level of your heart while you are sitting or lying down.  Take over-the-counter and prescription medicines only as told by your doctor.  If directed, put ice on the injured area. ? If you have a removable splint or brace, remove it as told by your doctor. ? Put ice in a plastic bag. ? Place a towel between your skin and the bag or between your cast and the bag. ? Leave the ice on for 20  minutes, 2-3 times a day. General instructions  Do exercises as told by your doctor.  Keep all follow-up visits as told by your doctor. This is important. Contact a doctor if:  You have pain that gets worse.  The cast, brace, or splint does not fit right.  The cast, brace, or splint gets damaged. Get help right away if:  You cannot lean on your knee to stand or walk.  You cannot move the injured area.  You knee buckles or you have pain after you walk only a few steps.  You have very bad pain, swelling, or numbness below the cast, brace, or splint. Summary  A knee sprain is a stretch or tear in a band (ligament) that connects your knee bones to each other.  You may need to wear a splint, brace, or cast to help your knee get better.  Contact your doctor if you have very bad pain, swelling, or numbness, or if you cannot walk. This information is not intended to replace advice given to you by your health care provider. Make sure you discuss any questions you have with your health care provider. Document Released: 10/18/2009 Document Revised: 07/18/2016 Document Reviewed: 07/18/2016 Elsevier Interactive Patient Education  2017 Elsevier Inc.      Edwina Barth, MD [facility]

## 2018-07-29 ENCOUNTER — Other Ambulatory Visit: Payer: Self-pay | Admitting: Physician Assistant

## 2018-07-29 DIAGNOSIS — I10 Essential (primary) hypertension: Secondary | ICD-10-CM

## 2018-08-01 ENCOUNTER — Other Ambulatory Visit: Payer: Self-pay | Admitting: Emergency Medicine

## 2018-08-01 DIAGNOSIS — M25562 Pain in left knee: Secondary | ICD-10-CM

## 2018-08-01 NOTE — Telephone Encounter (Signed)
Refill of Voltaren  LRF 07/22/18  #20  0 refills  LOV  07/22/18 Dr. Drake LeachSagardia   [facility] [phone] - , [place] - [address] AT [facility] [address]

## 2018-11-01 ENCOUNTER — Other Ambulatory Visit: Payer: Self-pay | Admitting: Family Medicine

## 2018-11-01 DIAGNOSIS — I10 Essential (primary) hypertension: Secondary | ICD-10-CM

## 2019-02-21 ENCOUNTER — Other Ambulatory Visit: Payer: Self-pay | Admitting: Family Medicine

## 2019-02-21 DIAGNOSIS — I10 Essential (primary) hypertension: Secondary | ICD-10-CM

## 2019-02-21 NOTE — Telephone Encounter (Signed)
30 day courtesy refill given. 

## 2019-03-13 ENCOUNTER — Encounter: Payer: BLUE CROSS/BLUE SHIELD | Admitting: Family Medicine

## 2019-03-13 ENCOUNTER — Telehealth (INDEPENDENT_AMBULATORY_CARE_PROVIDER_SITE_OTHER): Payer: BLUE CROSS/BLUE SHIELD | Admitting: Family Medicine

## 2019-03-13 ENCOUNTER — Other Ambulatory Visit: Payer: Self-pay

## 2019-03-13 DIAGNOSIS — I10 Essential (primary) hypertension: Secondary | ICD-10-CM

## 2019-03-13 DIAGNOSIS — B181 Chronic viral hepatitis B without delta-agent: Secondary | ICD-10-CM

## 2019-03-13 MED ORDER — LISINOPRIL-HYDROCHLOROTHIAZIDE 10-12.5 MG PO TABS: 1.0000 | ORAL_TABLET | Freq: Every day | ORAL | 1 refills | Status: DC

## 2019-03-13 NOTE — Progress Notes (Signed)
   Virtual Visit Note  I connected with patient on 03/13/19 at 211pm by phone and verified that I am speaking with the correct person using two identifiers. Brian Preston is currently located at home and patient is currently with them during visit. The provider, Myles Lipps, MD is located in their office at time of visit.  I discussed the limitations, risks, security and privacy concerns of performing an evaluation and management service by telephone and the availability of in person appointments. I also discussed with the patient that there may be a patient responsible charge related to this service. The patient expressed understanding and agreed to proceed.   CC: followup on HTN  HPI ? 48 yo M with PMH HTN and chronic HBV infection  Last Brian Houston, PA-C, Feb 2019 Last saw ID, Dr Brian Preston, July 2019  Checks BP at home 120-130/70s Takes med daily as prescribed, denies side effects  Does not smoke Drinks etoh very rarely, 1 beer  Does not have any followup with ID, requesting referral again  Overall feeling well, has no acute concerns  No Known Allergies  Prior to Admission medications   Medication Sig Start Date End Date Taking? Authorizing Provider  lisinopril-hydrochlorothiazide (PRINZIDE,ZESTORETIC) 10-12.5 MG tablet TAKE 1 TABLET BY MOUTH EVERY DAY 02/21/19   Myles Lipps, MD    No past medical history on file.  No past surgical history on file.  Social History   Tobacco Use  . Smoking status: Former Smoker    Last attempt to quit: 11/22/1992    Years since quitting: 26.3  . Smokeless tobacco: Never Used  Substance Use Topics  . Alcohol use: No    Alcohol/week: 1.0 standard drinks    Types: 1 Cans of beer per week    Family History  Problem Relation Age of Onset  . Hypertension Mother   . Stroke Mother 40  . Hypertension Father     Review of Systems  Constitutional: Negative for chills and fever.  Respiratory: Negative for cough and shortness of breath.    Cardiovascular: Negative for chest pain, palpitations and leg swelling.  Gastrointestinal: Negative for abdominal pain, nausea and vomiting.    Objective  Vitals as reported by the patient: per above   ASSESSMENT and PLAN  1. Essential hypertension Controlled. Continue current regime.  - lisinopril-hydrochlorothiazide (ZESTORETIC) 10-12.5 MG tablet; Take 1 tablet by mouth daily. - CBC; Future - Comprehensive metabolic panel; Future - Lipid panel; Future - TSH; Future  2. Chronic hepatitis B (HCC) Discussed importance of avoidance of liver toxins - Ambulatory referral to Infectious Disease - Comprehensive metabolic panel; Future  FOLLOW-UP: 6 months for HTN   The above assessment and management plan was discussed with the patient. The patient verbalized understanding of and has agreed to the management plan. Patient is aware to call the clinic if symptoms persist or worsen. Patient is aware when to return to the clinic for a follow-up visit. Patient educated on when it is appropriate to go to the emergency department.    I provided 8 minutes of non-face-to-face time during this encounter.  Myles Lipps, MD Primary Care at [facility] [address] [place], [place] [address] Ph.  [phone] Fax [phone]

## 2019-03-13 NOTE — Progress Notes (Signed)
Needing refill on bp med. No medical concerns. Monitors bp daily. Numbers are in normal range

## 2019-03-31 ENCOUNTER — Telehealth: Payer: Self-pay | Admitting: Internal Medicine

## 2019-03-31 NOTE — Telephone Encounter (Signed)
COVID-19 Pre-Screening Questions: ° °Do you currently have a fever (>100 °F), chills or unexplained body aches? No  ° °Are you currently experiencing new cough, shortness of breath, sore throat, runny nose? No  °•  °Have you recently travelled outside the state of [place] in the last 14 days? No  °•  °1. Have you been in contact with someone that is currently pending confirmation of Covid19 testing or has been confirmed to have the Covid19 virus?  No  ° °

## 2019-04-01 ENCOUNTER — Encounter: Payer: Self-pay | Admitting: Internal Medicine

## 2019-04-01 ENCOUNTER — Other Ambulatory Visit: Payer: Self-pay

## 2019-04-01 ENCOUNTER — Ambulatory Visit: Payer: BLUE CROSS/BLUE SHIELD | Admitting: Internal Medicine

## 2019-04-01 VITALS — BP 132/82 | HR 90 | Temp 97.8°F | Wt 146.0 lb

## 2019-04-01 DIAGNOSIS — K7400 Hepatic fibrosis, unspecified: Secondary | ICD-10-CM

## 2019-04-01 DIAGNOSIS — B181 Chronic viral hepatitis B without delta-agent: Secondary | ICD-10-CM | POA: Diagnosis not present

## 2019-04-01 NOTE — Progress Notes (Signed)
   Subjective:    Patient ID: Brian Preston, male    DOB: 1971-06-29, 48 y.o.   MRN: [id number]  HPI Here for follow up of hepatitis B I saw him in July 2019 and noted a viral DNA of 19,200 with normal LFTs.  He had an elastgraphy that was F0/1 though concerns for cirrhosis on ultrasound but a follow up MRI confirmed no cirrhosis.  He was lost to follow up be returns today.  At the visit last year, there was no indication for treatment with no transaminitis, no liver fibrosis.  No new issues since.    Review of Systems  Constitutional: Negative for fatigue.  Gastrointestinal: Negative for diarrhea and nausea.  Skin: Negative for rash.       Objective:   Physical Exam Constitutional:      Appearance: Normal appearance.  HENT:     Mouth/Throat:     Pharynx: No posterior oropharyngeal erythema.  Eyes:     General: No scleral icterus. Cardiovascular:     Rate and Rhythm: Regular rhythm. Tachycardia present.     Heart sounds: No murmur.  Pulmonary:     Effort: Pulmonary effort is normal.     Breath sounds: Normal breath sounds.  Skin:    Findings: No rash.  Neurological:     Mental Status: He is alert.   SH: no alcohol        Assessment & Plan:

## 2019-04-01 NOTE — Assessment & Plan Note (Signed)
Follow up MRI with no cirrhosis.   Will continue to monitor HCC screening due to Asian male > 40

## 2019-04-01 NOTE — Assessment & Plan Note (Signed)
I will recheck his LFTs and DNA today.  Will conitnue to observe off of treatment.

## 2019-04-11 LAB — COMPLETE METABOLIC PANEL WITH GFR
AG Ratio: 1.3 (calc) (ref 1.0–2.5)
ALT: 17 U/L (ref 9–46)
AST: 17 U/L (ref 10–40)
Albumin: 4.3 g/dL (ref 3.6–5.1)
Alkaline phosphatase (APISO): 55 U/L (ref 36–130)
BUN: 16 mg/dL (ref 7–25)
CO2: 28 mmol/L (ref 20–32)
Calcium: 10 mg/dL (ref 8.6–10.3)
Chloride: 100 mmol/L (ref 98–110)
Creat: 1.1 mg/dL (ref 0.60–1.35)
GFR, Est African American: 92 mL/min/{1.73_m2} (ref >=60)
GFR, Est Non African American: 80 mL/min/{1.73_m2} (ref >=60)
Globulin: 3.3 g/dL (calc) (ref 1.9–3.7)
Glucose, Bld: 98 mg/dL (ref 65–99)
Potassium: 5 mmol/L (ref 3.5–5.3)
Sodium: 139 mmol/L (ref 135–146)
Total Bilirubin: 0.4 mg/dL (ref 0.2–1.2)
Total Protein: 7.6 g/dL (ref 6.1–8.1)

## 2019-04-11 LAB — HEPATITIS B DNA, ULTRAQUANTITATIVE, PCR
Hepatitis B DNA (Calc): 2.7 Log IU/mL — ABNORMAL HIGH
Hepatitis B DNA: 505 IU/mL — ABNORMAL HIGH

## 2019-04-30 ENCOUNTER — Other Ambulatory Visit: Payer: Self-pay

## 2019-04-30 ENCOUNTER — Encounter: Payer: BLUE CROSS/BLUE SHIELD | Admitting: Family Medicine

## 2019-09-17 ENCOUNTER — Other Ambulatory Visit: Payer: Self-pay | Admitting: Family Medicine

## 2019-09-17 DIAGNOSIS — I10 Essential (primary) hypertension: Secondary | ICD-10-CM

## 2019-09-17 NOTE — Telephone Encounter (Signed)
Requested medication (s) are due for refill today: yes  Requested medication (s) are on the active medication list: yes  Last refill:  06/19/2019  Future visit scheduled: no  Notes to clinic:  Overdue for office visit  Review for refill   Requested Prescriptions  Pending Prescriptions Disp Refills   lisinopril-hydrochlorothiazide (ZESTORETIC) 10-12.5 MG tablet [Pharmacy Med Name: LISINOPRIL-HCTZ 10/12.5MG  TABLETS] 90 tablet 1    Sig: Take 1 tablet by mouth daily.     Cardiovascular:  ACEI + Diuretic Combos Failed - 09/17/2019 12:15 PM      Failed - Valid encounter within last 6 months    Recent Outpatient Visits          6 months ago Chronic hepatitis B ([facility])   [facility] at [facility], Lilia Argue, MD   1 year ago Acute pain of left knee   Primary Care at [facility], Ines Bloomer, MD   1 year ago Annual physical exam   [facility], Damaris Hippo, PA-C   2 years ago Essential hypertension   [facility], Damaris Hippo, PA-C   2 years ago Essential hypertension   [facility], Damaris Hippo, PA-C      Future Appointments            In 2 weeks Comer, Okey Regal, MD [facility]           Passed - Na in normal range and within 180 days    Sodium  Date Value Ref Range Status  04/01/2019 139 135 - 146 mmol/L Final  01/04/2018 141 134 - 144 mmol/L Final         Passed - K in normal range and within 180 days    Potassium  Date Value Ref Range Status  04/01/2019 5.0 3.5 - 5.3 mmol/L Final         Passed - Cr in normal range and within 180 days    Creat  Date Value Ref Range Status  04/01/2019 1.10 0.60 - 1.35 mg/dL Final         Passed - Ca in normal range and within 180 days    Calcium  Date Value Ref Range Status  04/01/2019 10.0 8.6 - 10.3 mg/dL Final         Passed - Patient is not pregnant      Passed - Last BP in normal range    BP Readings from Last 1 Encounters:  04/01/19  132/82

## 2019-10-01 ENCOUNTER — Telehealth: Payer: Self-pay

## 2019-10-01 NOTE — Telephone Encounter (Signed)
COVID-19 Pre-Screening Questions:10/01/19   Do you currently have a fever (>100 F), chills or unexplained body aches? NO  Are you currently experiencing new cough, shortness of breath, sore throat, runny nose?NO  .  Have you recently travelled outside the state of [place] in the last 14 days? NO .  Have you been in contact with someone that is currently pending confirmation of Covid19 testing or has been confirmed to have the Covid19 virus? NO  **If the patient answers NO to ALL questions -  advise the patient to please call the clinic before coming to the office should any symptoms develop.     

## 2019-10-02 ENCOUNTER — Other Ambulatory Visit: Payer: Self-pay

## 2019-10-02 ENCOUNTER — Encounter: Payer: Self-pay | Admitting: Internal Medicine

## 2019-10-02 ENCOUNTER — Ambulatory Visit (INDEPENDENT_AMBULATORY_CARE_PROVIDER_SITE_OTHER): Payer: BLUE CROSS/BLUE SHIELD | Admitting: Internal Medicine

## 2019-10-02 VITALS — BP 131/88 | HR 82 | Temp 98.0°F | Ht 64.0 in | Wt 138.0 lb

## 2019-10-02 DIAGNOSIS — B181 Chronic viral hepatitis B without delta-agent: Secondary | ICD-10-CM

## 2019-10-02 DIAGNOSIS — K7400 Hepatic fibrosis, unspecified: Secondary | ICD-10-CM

## 2019-10-02 NOTE — Assessment & Plan Note (Signed)
He will continue with [facility] screening every 6 months

## 2019-10-02 NOTE — Progress Notes (Signed)
   Subjective:    Patient ID: Brian Preston, male    DOB: January 17, 1971, 48 y.o.   MRN: [id number]  HPI Here for follow up of chronic hepatitis B He has no new issues.  Last viral DNA was just 505.  AST and ALT remained wnl.  No complaints.    Review of Systems  Constitutional: Negative for fatigue.  Gastrointestinal: Negative for diarrhea and nausea.  Skin: Negative for rash.       Objective:   Physical Exam Constitutional:      Appearance: Normal appearance.  Eyes:     General: No scleral icterus. Cardiovascular:     Rate and Rhythm: Normal rate and regular rhythm.     Heart sounds: No murmur.  Pulmonary:     Effort: Pulmonary effort is normal.     Breath sounds: Normal breath sounds.  Skin:    Findings: No rash.  Neurological:     Mental Status: He is alert.  Psychiatric:        Mood and Affect: Mood normal.   SH: no alcohol        Assessment & Plan:

## 2019-10-02 NOTE — Assessment & Plan Note (Signed)
Doing well.  He is HepBeAg negative with normal LFTs, though fluctuating viral DNA.  Most recently is low.  He may be in the inactive carrier state vs chronic hepatitis B.  Will continue to monitor every 6 months.

## 2019-10-10 LAB — HEPATITIS B DNA, ULTRAQUANTITATIVE, PCR
Hepatitis B DNA (Calc): 1.94 Log IU/mL — ABNORMAL HIGH
Hepatitis B DNA: 86 IU/mL — ABNORMAL HIGH

## 2019-10-10 LAB — COMPLETE METABOLIC PANEL WITH GFR
AG Ratio: 1.3 (calc) (ref 1.0–2.5)
ALT: 17 U/L (ref 9–46)
AST: 15 U/L (ref 10–40)
Albumin: 4.5 g/dL (ref 3.6–5.1)
Alkaline phosphatase (APISO): 59 U/L (ref 36–130)
BUN: 18 mg/dL (ref 7–25)
CO2: 30 mmol/L (ref 20–32)
Calcium: 10 mg/dL (ref 8.6–10.3)
Chloride: 98 mmol/L (ref 98–110)
Creat: 1.02 mg/dL (ref 0.60–1.35)
GFR, Est African American: 101 mL/min/{1.73_m2} (ref >=60)
GFR, Est Non African American: 87 mL/min/{1.73_m2} (ref >=60)
Globulin: 3.5 g/dL (calc) (ref 1.9–3.7)
Glucose, Bld: 100 mg/dL — ABNORMAL HIGH (ref 65–99)
Potassium: 4.8 mmol/L (ref 3.5–5.3)
Sodium: 138 mmol/L (ref 135–146)
Total Bilirubin: 0.4 mg/dL (ref 0.2–1.2)
Total Protein: 8 g/dL (ref 6.1–8.1)

## 2019-10-10 LAB — HEPATITIS DELTA ANTIBODY: Hepatitis D Ab, Total: NEGATIVE

## 2020-03-08 ENCOUNTER — Other Ambulatory Visit: Payer: Self-pay | Admitting: Family Medicine

## 2020-03-08 DIAGNOSIS — I10 Essential (primary) hypertension: Secondary | ICD-10-CM

## 2020-03-08 NOTE — Telephone Encounter (Signed)
Phone call to pt.  Advised pt. he will need an office appt., as he has not been seen for approx. On year.  Pt. Is in agreement to schedule an appt.  Attempted to transfer to the office; due to office closed for lunch, advised pt. Will send note to office, and have a representative contact him to schedule an appt.  Will refill 30 day supply of Lisinopril-Hydrochlorothiazide at this time, since pt. needs OV.

## 2020-03-18 ENCOUNTER — Telehealth: Payer: Self-pay | Admitting: Family Medicine

## 2020-03-18 ENCOUNTER — Other Ambulatory Visit: Payer: Self-pay

## 2020-03-18 DIAGNOSIS — Z130 Encounter for screening for diseases of the blood and blood-forming organs and certain disorders involving the immune mechanism: Secondary | ICD-10-CM

## 2020-03-18 DIAGNOSIS — R7989 Other specified abnormal findings of blood chemistry: Secondary | ICD-10-CM

## 2020-03-18 DIAGNOSIS — I10 Essential (primary) hypertension: Secondary | ICD-10-CM

## 2020-03-18 DIAGNOSIS — Z1329 Encounter for screening for other suspected endocrine disorder: Secondary | ICD-10-CM

## 2020-03-18 DIAGNOSIS — Z131 Encounter for screening for diabetes mellitus: Secondary | ICD-10-CM

## 2020-03-18 NOTE — Telephone Encounter (Signed)
Please place labs for pt's upcoming physical.

## 2020-03-18 NOTE — Telephone Encounter (Signed)
Labs placed.

## 2020-03-18 NOTE — Telephone Encounter (Signed)
Patient coming in 05/20/2020 for labs CPE coming up 05/25/2020

## 2020-03-30 ENCOUNTER — Ambulatory Visit: Payer: BLUE CROSS/BLUE SHIELD | Admitting: Family Medicine

## 2020-03-31 ENCOUNTER — Ambulatory Visit (INDEPENDENT_AMBULATORY_CARE_PROVIDER_SITE_OTHER): Payer: 59 | Admitting: Internal Medicine

## 2020-03-31 ENCOUNTER — Encounter: Payer: Self-pay | Admitting: Internal Medicine

## 2020-03-31 ENCOUNTER — Other Ambulatory Visit: Payer: Self-pay

## 2020-03-31 VITALS — BP 117/78 | HR 92 | Wt 141.0 lb

## 2020-03-31 DIAGNOSIS — Z9189 Other specified personal risk factors, not elsewhere classified: Secondary | ICD-10-CM

## 2020-03-31 DIAGNOSIS — B181 Chronic viral hepatitis B without delta-agent: Secondary | ICD-10-CM

## 2020-03-31 NOTE — Assessment & Plan Note (Signed)
Doing well with no indication for therapy.  Will continue to monitor every 54months

## 2020-03-31 NOTE — Assessment & Plan Note (Signed)
Will continue with HCC screening due to Asian male over 40.

## 2020-03-31 NOTE — Progress Notes (Signed)
   Subjective:    Patient ID: Brian Preston, male    DOB: 08/27/1971, 49 y.o.   MRN: [id number]  HPI Here for follow up of chronic hepatitis B He has no new issues.  Last viral DNA was just 86.  AST and ALT remained wnl.  No complaints. Ultrasound screening done at [facility] due to insurance issues.  Now has new insurance.     Review of Systems  Constitutional: Negative for fatigue.  Gastrointestinal: Negative for diarrhea and nausea.  Skin: Negative for rash.       Objective:   Physical Exam Constitutional:      Appearance: Normal appearance.  Eyes:     General: No scleral icterus. Pulmonary:     Effort: Pulmonary effort is normal.  Abdominal:     General: Abdomen is flat.     Palpations: Abdomen is soft.  Skin:    Findings: No rash.  Neurological:     General: No focal deficit present.     Mental Status: He is alert.  Psychiatric:        Mood and Affect: Mood normal.   SH: occasional alcohol        Assessment & Plan:

## 2020-04-01 ENCOUNTER — Ambulatory Visit
Admission: RE | Admit: 2020-04-01 | Discharge: 2020-04-01 | Disposition: A | Discharge status: Home / Self Care | Payer: 59 | Source: Ambulatory Visit | Attending: Internal Medicine | Admitting: Internal Medicine

## 2020-04-01 DIAGNOSIS — B181 Chronic viral hepatitis B without delta-agent: Secondary | ICD-10-CM

## 2020-04-01 DIAGNOSIS — Z9189 Other specified personal risk factors, not elsewhere classified: Secondary | ICD-10-CM

## 2020-04-02 LAB — COMPLETE METABOLIC PANEL WITH GFR
AG Ratio: 1.4 (calc) (ref 1.0–2.5)
ALT: 11 U/L (ref 9–46)
AST: 12 U/L (ref 10–40)
Albumin: 4.2 g/dL (ref 3.6–5.1)
Alkaline phosphatase (APISO): 52 U/L (ref 36–130)
BUN: 17 mg/dL (ref 7–25)
CO2: 33 mmol/L — ABNORMAL HIGH (ref 20–32)
Calcium: 9.9 mg/dL (ref 8.6–10.3)
Chloride: 100 mmol/L (ref 98–110)
Creat: 0.95 mg/dL (ref 0.60–1.35)
GFR, Est African American: 109 mL/min/{1.73_m2} (ref >=60)
GFR, Est Non African American: 94 mL/min/{1.73_m2} (ref >=60)
Globulin: 3.1 g/dL (calc) (ref 1.9–3.7)
Glucose, Bld: 96 mg/dL (ref 65–99)
Potassium: 5.1 mmol/L (ref 3.5–5.3)
Sodium: 137 mmol/L (ref 135–146)
Total Bilirubin: 0.5 mg/dL (ref 0.2–1.2)
Total Protein: 7.3 g/dL (ref 6.1–8.1)

## 2020-04-02 LAB — HEPATITIS B DNA, ULTRAQUANTITATIVE, PCR
Hepatitis B DNA (Calc): 1 Log IU/mL — ABNORMAL HIGH
Hepatitis B DNA: 10 IU/mL — ABNORMAL HIGH

## 2020-04-30 ENCOUNTER — Other Ambulatory Visit: Payer: Self-pay | Admitting: Family Medicine

## 2020-04-30 DIAGNOSIS — I10 Essential (primary) hypertension: Secondary | ICD-10-CM

## 2020-05-02 ENCOUNTER — Other Ambulatory Visit: Payer: Self-pay | Admitting: Family Medicine

## 2020-05-02 DIAGNOSIS — I10 Essential (primary) hypertension: Secondary | ICD-10-CM

## 2020-05-03 ENCOUNTER — Other Ambulatory Visit: Payer: Self-pay

## 2020-05-03 DIAGNOSIS — I10 Essential (primary) hypertension: Secondary | ICD-10-CM

## 2020-05-03 MED ORDER — LISINOPRIL-HYDROCHLOROTHIAZIDE 10-12.5 MG PO TABS: 1.0000 | ORAL_TABLET | Freq: Every day | ORAL | 0 refills | Status: DC

## 2020-05-03 NOTE — Telephone Encounter (Signed)
Pt needs appt for more bp mds, please call make appt or next available pcp

## 2020-05-03 NOTE — Telephone Encounter (Signed)
05/03/2020 - PATIENT IS REQUESTING A REFILL ON HIS LISINOPRIL 10-12.5 mg. MESSAGE SAYS HE NEEDS AN OFFICE VISIT BEFORE HE CAN GET MORE REFILLS. HE HAS A NURSE'S APPOINTMENT SCHEDULED FOR Thursday 05/20/2020 AND A COMPLETE PHYSICAL SCHEDULED FOR Tuesday 05/25/2020 WITH DR. Darcel Bayley. MBC

## 2020-05-03 NOTE — Telephone Encounter (Signed)
Medication sent for 30 tabs with no refills. Appt has been confirmed

## 2020-05-20 ENCOUNTER — Ambulatory Visit: Payer: BLUE CROSS/BLUE SHIELD

## 2020-05-24 ENCOUNTER — Ambulatory Visit: Payer: Self-pay

## 2020-05-25 ENCOUNTER — Other Ambulatory Visit: Payer: Self-pay

## 2020-05-25 ENCOUNTER — Encounter: Payer: Self-pay | Admitting: Family Medicine

## 2020-05-25 ENCOUNTER — Ambulatory Visit (INDEPENDENT_AMBULATORY_CARE_PROVIDER_SITE_OTHER): Payer: 59 | Admitting: Family Medicine

## 2020-05-25 VITALS — BP 134/88 | HR 80 | Temp 98.6°F | Ht 64.0 in | Wt 138.0 lb

## 2020-05-25 DIAGNOSIS — Z0001 Encounter for general adult medical examination with abnormal findings: Secondary | ICD-10-CM | POA: Diagnosis not present

## 2020-05-25 DIAGNOSIS — B181 Chronic viral hepatitis B without delta-agent: Secondary | ICD-10-CM

## 2020-05-25 DIAGNOSIS — Z0000 Encounter for general adult medical examination without abnormal findings: Secondary | ICD-10-CM

## 2020-05-25 DIAGNOSIS — I10 Essential (primary) hypertension: Secondary | ICD-10-CM | POA: Diagnosis not present

## 2020-05-25 DIAGNOSIS — Z8719 Personal history of other diseases of the digestive system: Secondary | ICD-10-CM | POA: Diagnosis not present

## 2020-05-25 DIAGNOSIS — Z1211 Encounter for screening for malignant neoplasm of colon: Secondary | ICD-10-CM | POA: Diagnosis not present

## 2020-05-25 MED ORDER — LISINOPRIL-HYDROCHLOROTHIAZIDE 10-12.5 MG PO TABS: 1.0000 | ORAL_TABLET | Freq: Every day | ORAL | 1 refills | Status: DC

## 2020-05-25 NOTE — Progress Notes (Signed)
7/13/20218:45 AM  Brian Preston [birth year], 49 y.o., male [id number]  Chief Complaint  Patient presents with  . Annual Exam  . Referral    has dealt with bloody stool for years, told by specialist that there was nothing found. Bleeding has started bk    HPI:   Patient is a 49 y.o. 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 who presents today for CPE  Colorectal Cancer Screening:  Due according to new guidelines, patient reports long standing h/o intermittent red blood with clots in stool, last episode about a month ago Prostate Cancer Screening: denies HIV Screening: 2018 STI Screening: declines Seasonal Influenza Vaccination: gets seasonal Has completed covid vaccines Td/Tdap Vaccination: 2017 Pneumococcal Vaccination: at age 82 Zoster Vaccination: at age 27 Frequency of Dental evaluation: Q6 months Frequency of Eye evaluation: yearly Denies t/e/d use Works as Scientist, forensic Married, 2 young adult children Sees Dr Commer, ID, last OV May 2021 - no indication for tx at this time   Depression screen [facility] 2/9 03/31/2020 10/02/2019 04/01/2019  Decreased Interest 0 0 0  Down, Depressed, Hopeless 0 0 0  PHQ - 2 Score 0 0 0  Altered sleeping - - -  Tired, decreased energy - - -  Change in appetite - - -  Feeling bad or failure about yourself  - - -  Trouble concentrating - - -  Moving slowly or fidgety/restless - - -  Suicidal thoughts - - -  PHQ-9 Score - - -    Fall Risk  05/25/2020 03/31/2020 10/02/2019 04/01/2019 03/13/2019  Falls in the past year? 0 0 0 0 0  Number falls in past yr: 0 - 0 - 0  Injury with Fall? 0 - 0 - 0  Comment - - - - -  Follow up - Falls evaluation completed - - -     No Known Allergies  Prior to Admission medications   Medication Sig Start Date End Date Taking? Authorizing Provider  lisinopril-hydrochlorothiazide (ZESTORETIC) 10-12.5 MG tablet Take 1 tablet by mouth daily. 05/03/20  Yes Rutherford Guys, MD     History reviewed. No pertinent past medical history.  History reviewed. No pertinent surgical history.  Social History   Tobacco Use  . Smoking status: Former Smoker    Quit date: 11/22/1992    Years since quitting: 27.5  . Smokeless tobacco: Never Used  Substance Use Topics  . Alcohol use: No    Alcohol/week: 1.0 standard drink    Types: 1 Cans of beer per week    Family History  Problem Relation Age of Onset  . Hypertension Mother   . Stroke Mother 50  . Hypertension Father     Review of Systems  Constitutional: Negative for chills, fever, malaise/fatigue and weight loss.  Respiratory: Negative for cough and shortness of breath.   Cardiovascular: Negative for chest pain, palpitations and leg swelling.  Gastrointestinal: Positive for blood in stool. Negative for abdominal pain, constipation, diarrhea, melena, nausea and vomiting.  Genitourinary: Negative for dysuria and hematuria.  Musculoskeletal: Negative for joint pain and myalgias.  Neurological: Negative for dizziness, focal weakness and headaches.  Endo/Heme/Allergies: Negative for polydipsia.  Psychiatric/Behavioral: Negative for depression. The patient is not nervous/anxious and does not have insomnia.   All other systems reviewed and are negative.    OBJECTIVE:  Today's Vitals   05/25/20 0824  BP: 134/88  Pulse: 80  Temp: 98.6 F (37 C)  SpO2: 97%  Weight: 138 lb (  62.6 kg)  Height: 5' 4"  (1.626 m)   Body mass index is 23.69 kg/m.    Physical Exam Vitals and nursing note reviewed. Exam conducted with a chaperone present.  Constitutional:      Appearance: He is well-developed.  HENT:     Head: Normocephalic and atraumatic.     Right Ear: Hearing, tympanic membrane, ear canal and external ear normal.     Left Ear: Hearing, tympanic membrane, ear canal and external ear normal.     Mouth/Throat:     Pharynx: No oropharyngeal exudate.  Eyes:     Conjunctiva/sclera: Conjunctivae normal.      Pupils: Pupils are equal, round, and reactive to light.  Neck:     Thyroid: No thyromegaly.  Cardiovascular:     Rate and Rhythm: Normal rate and regular rhythm.     Heart sounds: Normal heart sounds. No murmur heard.  No friction rub. No gallop.   Pulmonary:     Effort: Pulmonary effort is normal.     Breath sounds: Normal breath sounds. No wheezing, rhonchi or rales.  Abdominal:     General: Bowel sounds are normal. There is no distension.     Palpations: Abdomen is soft. There is no mass.     Tenderness: There is no abdominal tenderness.  Genitourinary:    Prostate: Not enlarged, not tender and no nodules present.     Rectum: No mass, tenderness, anal fissure, external hemorrhoid or internal hemorrhoid. Normal anal tone.     Comments: Brown stool Musculoskeletal:        General: Normal range of motion.     Cervical back: Neck supple.     Right lower leg: No edema.     Left lower leg: No edema.  Lymphadenopathy:     Cervical: No cervical adenopathy.  Skin:    General: Skin is warm and dry.  Neurological:     Mental Status: He is alert and oriented to person, place, and time.     Cranial Nerves: No cranial nerve deficit.     Coordination: Coordination normal.     Gait: Gait normal.     Deep Tendon Reflexes: Reflexes are normal and symmetric.  Psychiatric:        Mood and Affect: Mood normal.        Behavior: Behavior normal.     No results found for this or any previous visit (from the past 24 hour(s)).  No results found.   ASSESSMENT and PLAN  1. Annual physical exam Routine HCM labs ordered. HCM reviewed/discussed. Anticipatory guidance regarding healthy weight, lifestyle and choices given.   2. Essential hypertension - CMP14+EGFR - Lipid panel - lisinopril-hydrochlorothiazide (ZESTORETIC) 10-12.5 MG tablet; Take 1 tablet by mouth daily.  3. History of bloody stools - Ambulatory referral to Gastroenterology - CBC - Protime-INR  4. Colon cancer  screening - Ambulatory referral to Gastroenterology  5. Chronic hepatitis B ([place]) Followed by ID  Return in about 6 months (around 11/25/2020) for HTN.    Rutherford Guys, MD [facility] at [facility] [place], [place] [address] Ph.  [phone] Fax [phone]

## 2020-05-25 NOTE — Patient Instructions (Addendum)
   If you have lab work done today you will be contacted with your lab results within the next 2 weeks.  If you have not heard from us then please contact us. The fastest way to get your results is to register for My Chart.   IF you received an x-ray today, you will receive an invoice from [facility]. Please contact [facility] at [phone] with questions or concerns regarding your invoice.   IF you received labwork today, you will receive an invoice from [facility]. Please contact [facility] at [phone] with questions or concerns regarding your invoice.   Our billing staff will not be able to assist you with questions regarding bills from these companies.  You will be contacted with the lab results as soon as they are available. The fastest way to get your results is to activate your My Chart account. Instructions are located on the last page of this paperwork. If you have not heard from us regarding the results in 2 weeks, please contact this office.     Preventive Care 40-64 Years Old, Male Preventive care refers to lifestyle choices and visits with your health care provider that can promote health and wellness. This includes:  A yearly physical exam. This is also called an annual well check.  Regular dental and eye exams.  Immunizations.  Screening for certain conditions.  Healthy lifestyle choices, such as eating a healthy diet, getting regular exercise, not using drugs or products that contain nicotine and tobacco, and limiting alcohol use. What can I expect for my preventive care visit? Physical exam Your health care provider will check:  Height and weight. These may be used to calculate body mass index (BMI), which is a measurement that tells if you are at a healthy weight.  Heart rate and blood pressure.  Your skin for abnormal spots. Counseling Your health care provider may ask you questions about:  Alcohol, tobacco, and drug use.  Emotional  well-being.  Home and relationship well-being.  Sexual activity.  Eating habits.  Work and work environment. What immunizations do I need?  Influenza (flu) vaccine  This is recommended every year. Tetanus, diphtheria, and pertussis (Tdap) vaccine  You may need a Td booster every 10 years. Varicella (chickenpox) vaccine  You may need this vaccine if you have not already been vaccinated. Zoster (shingles) vaccine  You may need this after age 60. Measles, mumps, and rubella (MMR) vaccine  You may need at least one dose of MMR if you were born in 1957 or later. You may also need a second dose. Pneumococcal conjugate (PCV13) vaccine  You may need this if you have certain conditions and were not previously vaccinated. Pneumococcal polysaccharide (PPSV23) vaccine  You may need one or two doses if you smoke cigarettes or if you have certain conditions. Meningococcal conjugate (MenACWY) vaccine  You may need this if you have certain conditions. Hepatitis A vaccine  You may need this if you have certain conditions or if you travel or work in places where you may be exposed to hepatitis A. Hepatitis B vaccine  You may need this if you have certain conditions or if you travel or work in places where you may be exposed to hepatitis B. Haemophilus influenzae type b (Hib) vaccine  You may need this if you have certain risk factors. Human papillomavirus (HPV) vaccine  If recommended by your health care provider, you may need three doses over 6 months. You may receive vaccines as individual doses   or as more than one vaccine together in one shot (combination vaccines). Talk with your health care provider about the risks and benefits of combination vaccines. What tests do I need? Blood tests  Lipid and cholesterol levels. These may be checked every 5 years, or more frequently if you are over 62 years old.  Hepatitis C test.  Hepatitis B test. Screening  Lung cancer screening.  You may have this screening every year starting at age 62 if you have a 30-pack-year history of smoking and currently smoke or have quit within the past 15 years.  Prostate cancer screening. Recommendations will vary depending on your family history and other risks.  Colorectal cancer screening. All adults should have this screening starting at age 63 and continuing until age 34. Your health care provider may recommend screening at age 43 if you are at increased risk. You will have tests every 1-10 years, depending on your results and the type of screening test.  Diabetes screening. This is done by checking your blood sugar (glucose) after you have not eaten for a while (fasting). You may have this done every 1-3 years.  Sexually transmitted disease (STD) testing. Follow these instructions at home: Eating and drinking  Eat a diet that includes fresh fruits and vegetables, whole grains, lean protein, and low-fat dairy products.  Take vitamin and mineral supplements as recommended by your health care provider.  Do not drink alcohol if your health care provider tells you not to drink.  If you drink alcohol: ? Limit how much you have to 0-2 drinks a day. ? Be aware of how much alcohol is in your drink. In the [place] one drink equals one 12 oz bottle of beer (355 mL), one 5 oz glass of wine (148 mL), or one 1 oz glass of hard liquor (44 mL). Lifestyle  Take daily care of your teeth and gums.  Stay active. Exercise for at least 30 minutes on 5 or more days each week.  Do not use any products that contain nicotine or tobacco, such as cigarettes, e-cigarettes, and chewing tobacco. If you need help quitting, ask your health care provider.  If you are sexually active, practice safe sex. Use a condom or other form of protection to prevent STIs (sexually transmitted infections).  Talk with your health care provider about taking a low-dose aspirin every day starting at age 70. What's next?  Go  to your health care provider once a year for a well check visit.  Ask your health care provider how often you should have your eyes and teeth checked.  Stay up to date on all vaccines. This information is not intended to replace advice given to you by your health care provider. Make sure you discuss any questions you have with your health care provider. Document Revised: 10/24/2018 Document Reviewed: 10/24/2018 Elsevier Patient Education  2020 [employer].

## 2020-05-26 LAB — CMP14+EGFR
ALT: 13 IU/L (ref 0–44)
AST: 15 IU/L (ref 0–40)
Albumin/Globulin Ratio: 1.5 (ref 1.2–2.2)
Albumin: 4.4 g/dL (ref 4.0–5.0)
Alkaline Phosphatase: 65 IU/L (ref 48–121)
BUN/Creatinine Ratio: 18 (ref 9–20)
BUN: 17 mg/dL (ref 6–24)
Bilirubin Total: 0.7 mg/dL (ref 0.0–1.2)
CO2: 24 mmol/L (ref 20–29)
Calcium: 9.6 mg/dL (ref 8.7–10.2)
Chloride: 100 mmol/L (ref 96–106)
Creatinine, Ser: 0.95 mg/dL (ref 0.76–1.27)
GFR calc Af Amer: 109 mL/min/{1.73_m2} (ref >59)
GFR calc non Af Amer: 94 mL/min/{1.73_m2} (ref >59)
Globulin, Total: 3 g/dL (ref 1.5–4.5)
Glucose: 86 mg/dL (ref 65–99)
Potassium: 3.9 mmol/L (ref 3.5–5.2)
Sodium: 138 mmol/L (ref 134–144)
Total Protein: 7.4 g/dL (ref 6.0–8.5)

## 2020-05-26 LAB — CBC
Hematocrit: 47.5 % (ref 37.5–51.0)
Hemoglobin: 15.8 g/dL (ref 13.0–17.7)
MCH: 28.9 pg (ref 26.6–33.0)
MCHC: 33.3 g/dL (ref 31.5–35.7)
MCV: 87 fL (ref 79–97)
Platelets: 321 10*3/uL (ref 150–450)
RBC: 5.46 x10E6/uL (ref 4.14–5.80)
RDW: 13.7 % (ref 11.6–15.4)
WBC: 8.8 10*3/uL (ref 3.4–10.8)

## 2020-05-26 LAB — LIPID PANEL
Chol/HDL Ratio: 3.5 ratio (ref 0.0–5.0)
Cholesterol, Total: 176 mg/dL (ref 100–199)
HDL: 50 mg/dL (ref >39)
LDL Chol Calc (NIH): 113 mg/dL — ABNORMAL HIGH (ref 0–99)
Triglycerides: 66 mg/dL (ref 0–149)
VLDL Cholesterol Cal: 13 mg/dL (ref 5–40)

## 2020-05-26 LAB — PROTIME-INR
INR: 1 (ref 0.9–1.2)
Prothrombin Time: 11 s (ref 9.1–12.0)

## 2020-06-17 ENCOUNTER — Encounter: Payer: Self-pay | Admitting: Nurse Practitioner

## 2020-06-17 ENCOUNTER — Telehealth: Payer: Self-pay

## 2020-06-17 NOTE — Telephone Encounter (Signed)
I have called pt to give them the provider's message for him to schedule appt with GI and the reason behind it.  "Myles Lipps, MD  P Pcp Clinical Message Pool  Please provide patient with information for him to schedule appointment with GI given h/o bloody stools and need for routine colon cancer screening (per new guidelines, 45 and older). thanks   [facility]  Address: [address] [place], [place], [place] [address]  Phone: [phone]"

## 2020-08-03 ENCOUNTER — Encounter: Payer: Self-pay | Admitting: Nurse Practitioner

## 2020-08-03 ENCOUNTER — Ambulatory Visit (INDEPENDENT_AMBULATORY_CARE_PROVIDER_SITE_OTHER): Payer: 59 | Admitting: Nurse Practitioner

## 2020-08-03 VITALS — BP 116/70 | HR 76 | Ht 64.0 in | Wt 137.4 lb

## 2020-08-03 DIAGNOSIS — K625 Hemorrhage of anus and rectum: Secondary | ICD-10-CM

## 2020-08-03 MED ORDER — NA SULFATE-K SULFATE-MG SULF 17.5-3.13-1.6 GM/177ML PO SOLN: 1.0000 | Freq: Once | ORAL | 0 refills | Status: AC

## 2020-08-03 NOTE — Patient Instructions (Signed)
If you are age 48 or older, your body mass index should be between 23-30. Your Body mass index is 23.58 kg/m. If this is out of the aforementioned range listed, please consider follow up with your Primary Care Provider.  If you are age 37 or younger, your body mass index should be between 19-25. Your Body mass index is 23.58 kg/m. If this is out of the aformentioned range listed, please consider follow up with your Primary Care Provider.   We have sent the following medications to your pharmacy for you to pick up at your convenience: suprep  Due to recent changes in healthcare laws, you may see the results of your imaging and laboratory studies on MyChart before your provider has had a chance to review them.  We understand that in some cases there may be results that are confusing or concerning to you. Not all laboratory results come back in the same time frame and the provider may be waiting for multiple results in order to interpret others.  Please give [place] 48 hours in order for your provider to thoroughly review all the results before contacting the office for clarification of your results.

## 2020-08-03 NOTE — Progress Notes (Signed)
     ASSESSMENT AND PLAN    # Chronic, intermittent, painless rectal bleeding with BM --Hgb 15.8 --Normal hgb, no weight loss, no FMH of colon cancer.  Bleeding probably hemorrhoidal but needs colonoscopy to exclude polyps, neoplasm or other etiologies. The risks and benefits of colonoscopy with possible polypectomy / biopsies were discussed and the patient agrees to proceed.   # HTN, controlled on Lisinopril  # Chronic Hepatitis B --Followed by ID. Treatment not indicated at this time --HCC screening per ID.   HISTORY OF PRESENT ILLNESS     Primary Gastroenterologist : new   Chief Complaint : rectal bleeding  Brian Preston is a 49 y.o. male with PMH / PSH significant for,  but not necessarily limited to: chronic HBV, HTN.   Patient is referred by PCP for evaluation of blood in stool. He is [place] accompanied by an interpreter. He has had intermittent rectal bleeding with BMs for a couple of years. Sometimes he bleeds for several consecutive days then may not bleed for a month or so. Blood is dark red, can fill the toilet bowel at times. He has a soft BM every day. No associated rectal pain or abdominal pain . No constipation nor diarrhea. Weight is stable.   No FMH of colon cancer.   05/25/20 CBC normal. Hgb 15.8  PMH: HTN, chronic hepatitis B   History reviewed. No pertinent surgical history. Family History  Problem Relation Age of Onset  . Hypertension Mother   . Stroke Mother 35  . Hypertension Father   . Colon cancer Neg Hx   . Stomach cancer Neg Hx   . Esophageal cancer Neg Hx   . Pancreatic cancer Neg Hx    Social History   Tobacco Use  . Smoking status: Former Smoker    Quit date: 11/22/1992    Years since quitting: 27.7  . Smokeless tobacco: Never Used  Vaping Use  . Vaping Use: Never used  Substance Use Topics  . Alcohol use: No    Alcohol/week: 1.0 standard drink    Types: 1 Cans of beer per week  . Drug use: No   Current Outpatient Medications    Medication Sig Dispense Refill  . lisinopril-hydrochlorothiazide (ZESTORETIC) 10-12.5 MG tablet Take 1 tablet by mouth daily. 90 tablet 1   No current facility-administered medications for this visit.   No Known Allergies   Review of Systems:  All systems reviewed and negative except where noted in HPI.   PHYSICAL EXAM :    Wt Readings from Last 3 Encounters:  08/03/20 137 lb 6 oz (62.3 kg)  05/25/20 138 lb (62.6 kg)  03/31/20 141 lb (64 kg)    BP 116/70   Pulse 76   Ht 5\' 4"  (1.626 m)   Wt 137 lb 6 oz (62.3 kg)   BMI 23.58 kg/m  Constitutional:  Pleasant Asian male in no acute distress. Psychiatric: Normal mood and affect. Behavior is normal. EENT: Pupils normal.  Conjunctivae are normal. No scleral icterus. Neck supple.  Cardiovascular: Normal rate, regular rhythm. No edema Pulmonary/chest: Effort normal and breath sounds normal. No wheezing, rales or rhonchi. Abdominal: Soft, nondistended, nontender. Bowel sounds active throughout. There are no masses palpable. No hepatomegaly. Neurological: Alert and oriented to person place and time. Skin: Skin is warm and dry. No rashes noted.  , NP  08/03/2020, 2:47 PM  Cc:  Referring Provider 08/05/2020, MD

## 2020-08-13 NOTE — Progress Notes (Signed)
Addendum: Reviewed and agree with assessment and management plan. Makaio Mach M, MD  

## 2020-08-18 ENCOUNTER — Ambulatory Visit (AMBULATORY_SURGERY_CENTER): Payer: 59 | Admitting: Internal Medicine

## 2020-08-18 ENCOUNTER — Encounter: Payer: Self-pay | Admitting: Internal Medicine

## 2020-08-18 ENCOUNTER — Other Ambulatory Visit: Payer: Self-pay

## 2020-08-18 VITALS — BP 103/51 | HR 84 | Temp 97.8°F | Resp 24 | Ht 64.0 in | Wt 137.0 lb

## 2020-08-18 DIAGNOSIS — K648 Other hemorrhoids: Secondary | ICD-10-CM

## 2020-08-18 DIAGNOSIS — K5730 Diverticulosis of large intestine without perforation or abscess without bleeding: Secondary | ICD-10-CM

## 2020-08-18 DIAGNOSIS — K625 Hemorrhage of anus and rectum: Secondary | ICD-10-CM

## 2020-08-18 MED ORDER — SODIUM CHLORIDE 0.9 % IV SOLN: 500.0000 mL | Freq: Once | INTRAVENOUS | Status: DC

## 2020-08-18 NOTE — Op Note (Signed)
[facility] Patient Name: Brian Preston Procedure Date: 08/18/2020 10:30 AM MRN: [id number] Endoscopist: Beverley Fiedler , MD Age: 49 Referring MD:  Date of Birth: [birth year] Gender: Male Account #: [id number] Procedure:                Colonoscopy Indications:              Rectal bleeding Medicines:                Monitored Anesthesia Care Procedure:                Pre-Anesthesia Assessment:                           - Prior to the procedure, a History and Physical                            was performed, and patient medications and                            allergies were reviewed. The patient's tolerance of                            previous anesthesia was also reviewed. The risks                            and benefits of the procedure and the sedation                            options and risks were discussed with the patient.                            All questions were answered, and informed consent                            was obtained. Prior Anticoagulants: The patient has                            taken no previous anticoagulant or antiplatelet                            agents. ASA Grade Assessment: II - A patient with                            mild systemic disease. After reviewing the risks                            and benefits, the patient was deemed in                            satisfactory condition to undergo the procedure.                           After obtaining informed consent, the colonoscope  was passed under direct vision. Throughout the                            procedure, the patient's blood pressure, pulse, and                            oxygen saturations were monitored continuously. The                            Colonoscope was introduced through the anus and                            advanced to the cecum, identified by appendiceal                            orifice and ileocecal valve. The colonoscopy was                             performed without difficulty. The patient tolerated                            the procedure well. The quality of the bowel                            preparation was excellent. The ileocecal valve,                            appendiceal orifice, and rectum were photographed. Scope In: 10:44:08 AM Scope Out: 10:56:26 AM Scope Withdrawal Time: 0 hours 8 minutes 37 seconds  Total Procedure Duration: 0 hours 12 minutes 18 seconds  Findings:                 The digital rectal exam was normal.                           A few small-mouthed diverticula were found in the                            sigmoid colon.                           Internal hemorrhoids were found during                            retroflexion. The hemorrhoids were small.                           The exam was otherwise without abnormality. Complications:            No immediate complications. Estimated Blood Loss:     Estimated blood loss: none. Impression:               - Diverticulosis in the sigmoid colon.                           - Internal hemorrhoids (source of intermittent  rectal bleeding).                           - The examination was otherwise normal.                           - No specimens collected. Recommendation:           - Patient has a contact number available for                            emergencies. The signs and symptoms of potential                            delayed complications were discussed with the                            patient. Return to normal activities tomorrow.                            Written discharge instructions were provided to the                            patient.                           - Resume previous diet.                           - Continue present medications.                           - If hemorrhoidal bleeding continues and is                            bothersome hemorrhoidal banding in office can be                             performed to treat hemorrhoids.                           - Repeat colonoscopy in 10 years for screening                            purposes. Beverley Fiedler, MD 08/18/2020 11:00:21 AM This report has been signed electronically.

## 2020-08-18 NOTE — Patient Instructions (Addendum)
Handouts were given to you on  Diverticulosis, Hemorrhoids and [facility]. You may resume your current medications today. If you are interested having your hemorrhoids banded, please call the office to schedule.  [phone]. Please call if any questions or concerns.     YOU HAD AN ENDOSCOPIC PROCEDURE TODAY AT THE [facility]:   Refer to the procedure report that was given to you for any specific questions about what was found during the examination.  If the procedure report does not answer your questions, please call your gastroenterologist to clarify.  If you requested that your care partner not be given the details of your procedure findings, then the procedure report has been included in a sealed envelope for you to review at your convenience later.  YOU SHOULD EXPECT: Some feelings of bloating in the abdomen. Passage of more gas than usual.  Walking can help get rid of the air that was put into your GI tract during the procedure and reduce the bloating. If you had a lower endoscopy (such as a colonoscopy or flexible sigmoidoscopy) you may notice spotting of blood in your stool or on the toilet paper. If you underwent a bowel prep for your procedure, you may not have a normal bowel movement for a few days.  Please Note:  You might notice some irritation and congestion in your nose or some drainage.  This is from the oxygen used during your procedure.  There is no need for concern and it should clear up in a day or so.  SYMPTOMS TO REPORT IMMEDIATELY:   Following lower endoscopy (colonoscopy or flexible sigmoidoscopy):  Excessive amounts of blood in the stool  Significant tenderness or worsening of abdominal pains  Swelling of the abdomen that is new, acute  Fever of 100F or higher   For urgent or emergent issues, a gastroenterologist can be reached at any hour by calling [phone]. Do not use MyChart messaging for urgent concerns.    DIET:   We do recommend a small meal at first, but then you may proceed to your regular diet.  Drink plenty of fluids but you should avoid alcoholic beverages for 24 hours.  ACTIVITY:  You should plan to take it easy for the rest of today and you should NOT DRIVE or use heavy machinery until tomorrow (because of the sedation medicines used during the test).    FOLLOW UP: Our staff will call the number listed on your records 48-72 hours following your procedure to check on you and address any questions or concerns that you may have regarding the information given to you following your procedure. If we do not reach you, we will leave a message.  We will attempt to reach you two times.  During this call, we will ask if you have developed any symptoms of COVID 19. If you develop any symptoms (ie: fever, flu-like symptoms, shortness of breath, cough etc.) before then, please call [phone].  If you test positive for Covid 19 in the 2 weeks post procedure, please call and report this information to [place].    If any biopsies were taken you will be contacted by phone or by letter within the next 1-3 weeks.  Please call us at [phone] if you have not heard about the biopsies in 3 weeks.    SIGNATURES/CONFIDENTIALITY: You and/or your care partner have signed paperwork which will be entered into your electronic medical record.  These signatures attest to the fact that that  the information above on your After Visit Summary has been reviewed and is understood.  Full responsibility of the confidentiality of this discharge information lies with you and/or your care-partner.

## 2020-08-18 NOTE — Progress Notes (Signed)
pt tolerated well. VSS. awake and to recovery. Report given to RN.  

## 2020-08-18 NOTE — Progress Notes (Addendum)
Per Dr. Rhea Belton, pt to call the office if he is interested in having [facility] Hemorrhoidal Banding.  Phamphlet was given to pt with Dr. Lauro Franklin busniss card.  Pt's son was his interpreter in the recovery room.  Dr. Rhea Belton went over finds with pt's son and pt.   No problems noted in the recovery room. Maw

## 2020-08-20 ENCOUNTER — Telehealth: Payer: Self-pay

## 2020-08-20 NOTE — Telephone Encounter (Signed)
  Follow up Call-  Call back number 08/18/2020  Post procedure Call Back phone  # [phone]  Permission to leave phone message Yes  Some recent data might be hidden     Patient questions:  Do you have a fever, pain , or abdominal swelling? No. Pain Score  0 *  Have you tolerated food without any problems? Yes.    Have you been able to return to your normal activities? Yes.    Do you have any questions about your discharge instructions: Diet   No. Medications  No. Follow up visit  No.  Do you have questions or concerns about your Care? No.  Actions: * If pain score is 4 or above: No action needed, pain <4.  1. Have you developed a fever since your procedure? no  2.   Have you had an respiratory symptoms (SOB or cough) since your procedure? no  3.   Have you tested positive for COVID 19 since your procedure no  4.   Have you had any family members/close contacts diagnosed with the COVID 19 since your procedure?  no   If yes to any of these questions please route to Laverna Peace, RN and Karlton Lemon, RN

## 2020-10-04 ENCOUNTER — Ambulatory Visit (INDEPENDENT_AMBULATORY_CARE_PROVIDER_SITE_OTHER): Payer: 59 | Admitting: Internal Medicine

## 2020-10-04 ENCOUNTER — Encounter: Payer: Self-pay | Admitting: Internal Medicine

## 2020-10-04 ENCOUNTER — Other Ambulatory Visit: Payer: Self-pay

## 2020-10-04 VITALS — BP 125/86 | HR 73 | Temp 97.8°F | Wt 138.0 lb

## 2020-10-04 DIAGNOSIS — B181 Chronic viral hepatitis B without delta-agent: Secondary | ICD-10-CM | POA: Diagnosis not present

## 2020-10-04 DIAGNOSIS — K7400 Hepatic fibrosis, unspecified: Secondary | ICD-10-CM

## 2020-10-04 DIAGNOSIS — Z9189 Other specified personal risk factors, not elsewhere classified: Secondary | ICD-10-CM | POA: Diagnosis not present

## 2020-10-04 NOTE — Assessment & Plan Note (Signed)
Will continue with HCC screening every 6 months °

## 2020-10-04 NOTE — Progress Notes (Signed)
   Subjective:    Patient ID: Brian Preston, male    DOB: 06/25/1971, 49 y.o.   MRN: [id number]  HPI Here for follow up of chronic hepatitis B He has no new issues.  Last viral DNA was just 10.  AST and ALT remained wnl.  No complaints. Last ultrasound without issues.     Review of Systems  Constitutional: Negative for fatigue.  Gastrointestinal: Negative for diarrhea and nausea.  Skin: Negative for rash.       Objective:   Physical Exam Constitutional:      Appearance: Normal appearance.  Eyes:     General: No scleral icterus. Abdominal:     General: Abdomen is flat.     Palpations: Abdomen is soft.  Skin:    Findings: No rash.  Neurological:     General: No focal deficit present.     Mental Status: He is alert.  Psychiatric:        Mood and Affect: Mood normal.           Assessment & Plan:

## 2020-10-04 NOTE — Assessment & Plan Note (Signed)
Labs today and rtc in 6 months unless concerns

## 2020-10-06 LAB — HEPATITIS B DNA, ULTRAQUANTITATIVE, PCR
Hepatitis B DNA (Calc): <1 Log IU/mL — ABNORMAL HIGH
Hepatitis B DNA: <10 IU/mL — ABNORMAL HIGH

## 2020-10-06 LAB — HEPATITIS B SURFACE ANTIGEN: Hepatitis B Surface Ag: NONREACTIVE

## 2020-10-20 ENCOUNTER — Ambulatory Visit
Admission: RE | Admit: 2020-10-20 | Discharge: 2020-10-20 | Disposition: A | Discharge status: Home / Self Care | Payer: 59 | Source: Ambulatory Visit | Attending: Internal Medicine | Admitting: Internal Medicine

## 2020-10-20 DIAGNOSIS — K7400 Hepatic fibrosis, unspecified: Secondary | ICD-10-CM

## 2020-10-20 DIAGNOSIS — B181 Chronic viral hepatitis B without delta-agent: Secondary | ICD-10-CM

## 2020-10-20 DIAGNOSIS — Z9189 Other specified personal risk factors, not elsewhere classified: Secondary | ICD-10-CM

## 2020-10-21 ENCOUNTER — Other Ambulatory Visit: Payer: Self-pay | Admitting: Internal Medicine

## 2020-10-21 ENCOUNTER — Telehealth: Payer: Self-pay

## 2020-10-21 DIAGNOSIS — B181 Chronic viral hepatitis B without delta-agent: Secondary | ICD-10-CM

## 2020-10-21 DIAGNOSIS — Z9189 Other specified personal risk factors, not elsewhere classified: Secondary | ICD-10-CM

## 2020-10-21 NOTE — Telephone Encounter (Signed)
Call completed using [facility] Robynn Pane [phone]). RN informed patient that Dr. Luciana Axe has ordered an MRI for more information since the ultrasound did not give [place] enough information. Informed patient we will call again once it has been authorized and scheduled. Patient verbalized understanding and has no further questions.    Sandie Ano, RN

## 2020-10-21 NOTE — Telephone Encounter (Signed)
Call completed using [facility] [name] [phone]). RN notified patient of MRI appointment on 10/27/20 at 7am, instructed patient to arrive at [facility] radiology at 6:40am. Instructed patient nothing to eat or drink after the midnight before procedure. Advised patient on appointment location.   Patient repeated back appointment time and instructions and has no further questions.   Brian Preston

## 2020-10-21 NOTE — Telephone Encounter (Signed)
-----   Message from Gardiner Barefoot, MD sent at 10/21/2020  7:53 AM EST ----- His ultrasound was equivocal and needs an MRI.  I have ordered it if you could let the patient know.  thanks

## 2020-10-27 ENCOUNTER — Other Ambulatory Visit: Payer: Self-pay

## 2020-10-27 ENCOUNTER — Ambulatory Visit (HOSPITAL_COMMUNITY)
Admission: RE | Admit: 2020-10-27 | Discharge: 2020-10-27 | Disposition: A | Discharge status: Home / Self Care | Payer: 59 | Source: Ambulatory Visit | Attending: Internal Medicine | Admitting: Internal Medicine

## 2020-10-27 DIAGNOSIS — B181 Chronic viral hepatitis B without delta-agent: Secondary | ICD-10-CM | POA: Insufficient documentation

## 2020-10-27 DIAGNOSIS — Z9189 Other specified personal risk factors, not elsewhere classified: Secondary | ICD-10-CM | POA: Diagnosis present

## 2020-10-27 MED ORDER — GADOBUTROL 1 MMOL/ML IV SOLN
6.0000 mL | Freq: Once | INTRAVENOUS | Status: AC | PRN
  Administered 2020-10-27: 6 mL via INTRAVENOUS

## 2021-01-12 ENCOUNTER — Ambulatory Visit: Payer: 59 | Admitting: Registered Nurse

## 2021-01-14 ENCOUNTER — Encounter: Payer: Self-pay | Admitting: Family Medicine

## 2021-01-14 ENCOUNTER — Ambulatory Visit (INDEPENDENT_AMBULATORY_CARE_PROVIDER_SITE_OTHER): Payer: 59 | Admitting: Family Medicine

## 2021-01-14 ENCOUNTER — Other Ambulatory Visit: Payer: Self-pay

## 2021-01-14 VITALS — BP 142/80 | HR 105 | Temp 98.4°F | Ht 64.0 in | Wt 139.2 lb

## 2021-01-14 DIAGNOSIS — I10 Essential (primary) hypertension: Secondary | ICD-10-CM | POA: Diagnosis not present

## 2021-01-14 DIAGNOSIS — Z13220 Encounter for screening for lipoid disorders: Secondary | ICD-10-CM

## 2021-01-14 DIAGNOSIS — R002 Palpitations: Secondary | ICD-10-CM

## 2021-01-14 MED ORDER — LISINOPRIL-HYDROCHLOROTHIAZIDE 10-12.5 MG PO TABS: 1.0000 | ORAL_TABLET | Freq: Every day | ORAL | 2 refills | Status: DC

## 2021-01-14 NOTE — Patient Instructions (Addendum)
   Restart blood pressure medicine and make sure to take that every day.  If fast heart rate or headache occurs when you are back on medicine, follow-up to discuss those symptoms.  Recheck in 6 months for a physical.  Return to the clinic or go to the nearest emergency room if any of your symptoms worsen or new symptoms occur.   If you have lab work done today you will be contacted with your lab results within the next 2 weeks.  If you have not heard from [place] then please contact us. The fastest way to get your results is to register for My Chart.   IF you received an x-ray today, you will receive an invoice from [facility]. Please contact [facility] at [phone] with questions or concerns regarding your invoice.   IF you received labwork today, you will receive an invoice from [place]. Please contact [facility] at [phone] with questions or concerns regarding your invoice.   Our billing staff will not be able to assist you with questions regarding bills from these companies.  You will be contacted with the lab results as soon as they are available. The fastest way to get your results is to activate your My Chart account. Instructions are located on the last page of this paperwork. If you have not heard from [place] regarding the results in 2 weeks, please contact this office.

## 2021-01-14 NOTE — Progress Notes (Signed)
Subjective:  Patient ID: Brian Preston, male    DOB: 1971-01-22  Age: 50 y.o. MRN: [id number]  CC:  Chief Complaint  Patient presents with  . Hypertension    BP    HPI [place] Luppino presents for   Hypertension: Treated with lisinopril HCTZ 10/12.5 mg daily. Followed by infectious disease with chronic hepatitis B, last visit in November. Home readings: no home readings.  Ran out of meds a week ago.  No side effects on meds. Tired of meds. Sometimes heart rate faster off meds - none on meds. No chest pain.  Headache at times off blood pressure medicine, not when he takes his meds.  BP Readings from Last 3 Encounters:  01/14/21 (!) 142/80  10/04/20 125/86  08/18/20 (!) 103/51   Lab Results  Component Value Date   CREATININE 0.95 05/25/2020    Hyperlipidemia: Mild elevation prior - no meds. Last ate 3.5 hrs ago.  Lab Results  Component Value Date   CHOL 176 05/25/2020   HDL 50 05/25/2020   LDLCALC 113 (H) 05/25/2020   TRIG 66 05/25/2020   CHOLHDL 3.5 05/25/2020   Lab Results  Component Value Date   ALT 13 05/25/2020   AST 15 05/25/2020   ALKPHOS 65 05/25/2020   BILITOT 0.7 05/25/2020      History Patient Active Problem List   Diagnosis Date Noted  . At risk for cancer 03/31/2020  . Sprain of medial collateral ligament of left knee 07/22/2018  . Liver fibrosis 05/15/2017  . Essential hypertension 04/04/2017  . Elevated TSH 11/27/2016  . Chronic hepatitis B (HCC) 11/27/2016   Past Medical History:  Diagnosis Date  . Hepatitis B    ID  . Hypertension    No past surgical history on file. No Known Allergies Prior to Admission medications   Medication Sig Start Date End Date Taking? Authorizing Provider  lisinopril-hydrochlorothiazide (ZESTORETIC) 10-12.5 MG tablet Take 1 tablet by mouth daily. 05/25/20  Yes Lezlie Lye, Meda Coffee, MD   Social History   Socioeconomic History  . Marital status: Married    Spouse name: Tristan Schroeder  . Number of children: 2  . Years  of education: Not on file  . Highest education level: Not on file  Occupational History  . Not on file  Tobacco Use  . Smoking status: Former Smoker    Quit date: 11/22/1992    Years since quitting: 28.1  . Smokeless tobacco: Never Used  Vaping Use  . Vaping Use: Never used  Substance and Sexual Activity  . Alcohol use: No    Alcohol/week: 1.0 standard drink    Types: 1 Cans of beer per week  . Drug use: No  . Sexual activity: Yes    Partners: Female  Other Topics Concern  . Not on file  Social History Narrative   From [place] to [place] 1994   Lives with wife   Children - 2 - son and daughter   Social Determinants of Health   Financial Resource Strain: Not on file  Food Insecurity: Not on file  Transportation Needs: Not on file  Physical Activity: Not on file  Stress: Not on file  Social Connections: Not on file  Intimate Partner Violence: Not on file    Review of Systems  Constitutional: Negative for fatigue and unexpected weight change.  Eyes: Negative for visual disturbance.  Respiratory: Negative for cough, chest tightness and shortness of breath.   Cardiovascular: Positive for palpitations. Negative for chest pain and leg swelling.  Gastrointestinal: Negative for abdominal pain and blood in stool.  Neurological: Positive for headaches (As above with palpitations only when off meds.). Negative for dizziness and light-headedness.     Objective:   Vitals:   01/14/21 1315 01/14/21 1321  BP: (!) 135/92 (!) 142/80  Pulse: (!) 105   Temp: 98.4 F (36.9 C)   TempSrc: Temporal   SpO2: 98%   Weight: 139 lb 3.2 oz (63.1 kg)   Height: 5\' 4"  (1.626 m)      Physical Exam Vitals reviewed.  Constitutional:      Appearance: He is well-developed and well-nourished.  HENT:     Head: Normocephalic and atraumatic.  Eyes:     Extraocular Movements: EOM normal.     Pupils: Pupils are equal, round, and reactive to light.  Neck:     Vascular: No carotid bruit or JVD.   Cardiovascular:     Rate and Rhythm: Normal rate and regular rhythm.     Heart sounds: Normal heart sounds. No murmur heard.   Pulmonary:     Effort: Pulmonary effort is normal.     Breath sounds: Normal breath sounds. No rales.  Musculoskeletal:        General: No edema.  Skin:    General: Skin is warm and dry.  Neurological:     Mental Status: He is alert and oriented to person, place, and time.  Psychiatric:        Mood and Affect: Mood and affect normal.        Assessment & Plan:  Derron Pipkins is a 50 y.o. male . Essential hypertension - Plan: lisinopril-hydrochlorothiazide (ZESTORETIC) 10-12.5 MG tablet, Comprehensive metabolic panel, TSH  Screening for hyperlipidemia - Plan: Lipid panel  Palpitations - Plan: TSH  Decreased control off meds, and was tolerating medications well before he ran out.  Palpitations only off meds.  Remote history of abnormal TSH.  -Restart usual regimen of lisinopril HCTZ daily.   -Check labs above, borderline LDL previously.  -RTC precautions if any persistent headache or palpitations once he has returned to his usual regimen.  Recheck 6 months for physical  Meds ordered this encounter  Medications  . lisinopril-hydrochlorothiazide (ZESTORETIC) 10-12.5 MG tablet    Sig: Take 1 tablet by mouth daily.    Dispense:  90 tablet    Refill:  2   Patient Instructions     Restart blood pressure medicine and make sure to take that every day.  If fast heart rate or headache occurs when you are back on medicine, follow-up to discuss those symptoms.  Recheck in 6 months for a physical.  Return to the clinic or go to the nearest emergency room if any of your symptoms worsen or new symptoms occur.   If you have lab work done today you will be contacted with your lab results within the next 2 weeks.  If you have not heard from 52 then please contact us. The fastest way to get your results is to register for My Chart.   IF you received an x-ray  today, you will receive an invoice from [facility]. Please contact [facility] at [phone] with questions or concerns regarding your invoice.   IF you received labwork today, you will receive an invoice from [place]. Please contact [facility] at [phone] with questions or concerns regarding your invoice.   Our billing staff will not be able to assist you with questions regarding bills from these companies.  You will be contacted with the  lab results as soon as they are available. The fastest way to get your results is to activate your My Chart account. Instructions are located on the last page of this paperwork. If you have not heard from [place] regarding the results in 2 weeks, please contact this office.          Signed, Meredith Staggers, MD [facility]

## 2021-01-15 LAB — LIPID PANEL
Chol/HDL Ratio: 3.9 ratio (ref 0.0–5.0)
Cholesterol, Total: 189 mg/dL (ref 100–199)
HDL: 48 mg/dL (ref >39)
LDL Chol Calc (NIH): 125 mg/dL — ABNORMAL HIGH (ref 0–99)
Triglycerides: 88 mg/dL (ref 0–149)
VLDL Cholesterol Cal: 16 mg/dL (ref 5–40)

## 2021-01-15 LAB — COMPREHENSIVE METABOLIC PANEL
ALT: 10 IU/L (ref 0–44)
AST: 13 IU/L (ref 0–40)
Albumin/Globulin Ratio: 1.5 (ref 1.2–2.2)
Albumin: 4.4 g/dL (ref 4.0–5.0)
Alkaline Phosphatase: 66 IU/L (ref 44–121)
BUN/Creatinine Ratio: 15 (ref 9–20)
BUN: 14 mg/dL (ref 6–24)
Bilirubin Total: 0.3 mg/dL (ref 0.0–1.2)
CO2: 25 mmol/L (ref 20–29)
Calcium: 9.1 mg/dL (ref 8.7–10.2)
Chloride: 101 mmol/L (ref 96–106)
Creatinine, Ser: 0.93 mg/dL (ref 0.76–1.27)
Globulin, Total: 2.9 g/dL (ref 1.5–4.5)
Glucose: 71 mg/dL (ref 65–99)
Potassium: 4.7 mmol/L (ref 3.5–5.2)
Sodium: 141 mmol/L (ref 134–144)
Total Protein: 7.3 g/dL (ref 6.0–8.5)
eGFR: 101 mL/min/{1.73_m2} (ref >59)

## 2021-01-15 LAB — TSH: TSH: 2.35 u[IU]/mL (ref 0.450–4.500)

## 2021-04-05 ENCOUNTER — Encounter: Payer: Self-pay | Admitting: Internal Medicine

## 2021-04-05 ENCOUNTER — Other Ambulatory Visit: Payer: Self-pay

## 2021-04-05 ENCOUNTER — Ambulatory Visit (INDEPENDENT_AMBULATORY_CARE_PROVIDER_SITE_OTHER): Payer: 59 | Admitting: Internal Medicine

## 2021-04-05 VITALS — BP 110/76 | HR 88 | Temp 97.6°F | Wt 139.0 lb

## 2021-04-05 DIAGNOSIS — K7400 Hepatic fibrosis, unspecified: Secondary | ICD-10-CM | POA: Diagnosis not present

## 2021-04-05 DIAGNOSIS — B181 Chronic viral hepatitis B without delta-agent: Secondary | ICD-10-CM | POA: Diagnosis not present

## 2021-04-05 DIAGNOSIS — Z9189 Other specified personal risk factors, not elsewhere classified: Secondary | ICD-10-CM | POA: Diagnosis not present

## 2021-04-05 DIAGNOSIS — R42 Dizziness and giddiness: Secondary | ICD-10-CM | POA: Diagnosis not present

## 2021-04-05 NOTE — Progress Notes (Signed)
   Subjective:    Patient ID: Brian Preston, male    DOB: 01-11-1971, 49 y.o.   MRN: [id number]  HPI Here for follow up of chronic hepatitis B He is E Ag negative, initial viral load of 713,000 IU with initial mild transaminitis in 2015 but has been stable since that time. His initial elastography was F0/1 but the ultrasound was concerning for cirrhosis and a follow up MRI was not c/w cirrhosis.    A repeat MRI though in December 2021 was consistent with cirrhosis.   His viral load has remained low, declining over the years and now is < 10.  Additionally, his hepatitis B surface Ag is now negative.   His only issue he has had is a recent onset of vertigo.  He describes a heavy feeling, poor balance.  Happened once recently and ok now.  Is getting a new PCP since his previous one closed down.    Review of Systems  Constitutional: Negative for fatigue.  Skin: Negative for rash.  Neurological: Positive for dizziness. Negative for weakness and headaches.       Objective:   Physical Exam Constitutional:      Appearance: Normal appearance.  Eyes:     General: No scleral icterus. Pulmonary:     Effort: Pulmonary effort is normal.  Neurological:     General: No focal deficit present.     Mental Status: He is alert.  Psychiatric:        Mood and Affect: Mood normal.   SH: no alcohol        Assessment & Plan:

## 2021-04-06 ENCOUNTER — Encounter: Payer: Self-pay | Admitting: Internal Medicine

## 2021-04-06 DIAGNOSIS — R42 Dizziness and giddiness: Secondary | ICD-10-CM | POA: Insufficient documentation

## 2021-04-06 NOTE — Assessment & Plan Note (Signed)
elastography and previous MRI without concerns but more recent MRI concerning for cirrhosis.  I will consider a liver biopsy to confirm any liver issues.  Will discuss with him the next visit.  He does not drink alcohol.

## 2021-04-06 NOTE — Assessment & Plan Note (Signed)
It resolved spontaneously and he will discuss it with his new pcp

## 2021-04-06 NOTE — Assessment & Plan Note (Signed)
With his MRI findings plus chronic hepatitis B, will continue with HCC screening every 6 months.

## 2021-04-06 NOTE — Assessment & Plan Note (Signed)
His viral load is now suppressed and recent surface Ag negative.  I am going to repeat those tests now to see if that result holds and keep monitoring every 6 months to see if his active hepatitis B is resolving.   rtc in 6 months

## 2021-04-08 LAB — HEPATITIS B DNA, ULTRAQUANTITATIVE, PCR
Hepatitis B DNA (Calc): <1 Log IU/mL — ABNORMAL HIGH
Hepatitis B DNA: <10 IU/mL — ABNORMAL HIGH

## 2021-04-08 LAB — HEPATITIS B SURFACE ANTIGEN: Hepatitis B Surface Ag: NONREACTIVE

## 2021-04-27 ENCOUNTER — Inpatient Hospital Stay: Admission: RE | Admit: 2021-04-27 | Payer: 59 | Source: Ambulatory Visit

## 2021-05-09 NOTE — Progress Notes (Signed)
   Subjective:    Patient ID: Brian Preston, male    DOB: 02-26-1971, 50 y.o.   MRN: [id number]   CC: Establish care  HPI:  Brian Preston is a very pleasant 50 y.o. male who presents today to establish care.  Initial concerns:Feels like room is spinning for about 6 months. Hasn't yet tried anything for it. Has had it happen before in the past but it got better on it's own. No vomiting or nausea. He can cause it to happen by turning his head a certain.  Denies hearing loss.  Past medical history: Essential hypertension maintained on lisinopril hydrochlorothiazide 10-12.5 mg daily, chronic hepatitis B-follows with infectious disease.  Checking labs every 6 months.  Most recent surface antigen from 04/05/2021 nonreactive  Past surgical history: Endoscopy many years ago  Current medications: Lisinopril hydrochlorothiazide 10-12.5 mg daily  Family history:Mom and dad with hypertension. Mother had a stroke   Social history: Works in Education officer, community, he exercises daily.  Quit smoking 30 years ago, smoked about 1PPD for 10 years. Drinks socially. No illicit drugs.   [place] interpreter used for patient encounter  ROS: pertinent noted in the HPI   Objective:  BP 120/88   Pulse 86   Ht 5\' 4"  (1.626 m)   Wt 141 lb 6.4 oz (64.1 kg)   SpO2 98%   BMI 24.27 kg/m   Vitals and nursing note reviewed  General: NAD, pleasant, able to participate in exam Cardiac: RRR, normal heart sounds, no murmurs. Respiratory: CTAB, normal effort Abdomen: Bowel sounds present, non-tender, non-distended, no hepatosplenomegaly Extremities: no edema or cyanosis. Skin: warm and dry, no rashes noted Neuro: alert, no obvious focal deficits Psych: Normal affect and mood   Assessment & Plan:    Benign paroxysmal positional vertigo due to bilateral vestibular disorder Patient with symptoms of BPPV.  States that he can turn his head a certain way to make the room feel like it is spinning.  He has had this happen in the  past but it self resolved.  Denies nausea or vomiting.  Denies any hearing loss.  I did discuss with him the Epley maneuver and gave him a handout talk about how to do this.  He is going to try this at home.  I also placed a referral for neurovestibular rehab.  No concerning symptoms at this time.  Follow-up as needed.  Recent cholesterol panel on 01/15/2019 with LDL of 125.  A1c in 2019 was 5.5  Insomnia: Patient is going to keep a sleep diary of any difficulties with sleep.  He does snore on occasion and is going to ask a spouse to see if he has any symptoms of sleep apnea such as gasping for air or waking himself up from snoring, as well as how often he seems to snore.  He is going to follow-up in 1 to 2 weeks to further discuss this.  2020, DO [facility] PGY-2

## 2021-05-09 NOTE — Patient Instructions (Signed)
It was great to see you! Thank you for allowing me to participate in your care!  I recommend that you always bring your medications to each appointment as this makes it easy to ensure we are on the correct medications and helps [place] not miss when refills are needed.  Our plans for today:  -I am attaching some information about how to perform the Epley maneuver which can help with your dizziness symptoms.  I have also put in referral for vestibular rehab and they can help perform this maneuver and similar maneuvers to help reduce those symptoms. -I would like to see you back in 1 to 2 weeks to further discuss your insomnia and how we can help you with this.  Take care and seek immediate care sooner if you develop any concerns.   Dr. Jackelyn Poling, DO [facility]   Insomnia Insomnia is a sleep disorder that makes it difficult to fall asleep or stay asleep. Insomnia can cause fatigue, low energy, difficulty concentrating, moodswings, and poor performance at work or school. There are three different ways to classify insomnia: Difficulty falling asleep. Difficulty staying asleep. Waking up too early in the morning. Any type of insomnia can be long-term (chronic) or short-term (acute). Both are common. Short-term insomnia usually lasts for three months or less. Chronic insomnia occurs at least three times a week for longer than threemonths. What are the causes? Insomnia may be caused by another condition, situation, or substance, such as: Anxiety. Certain medicines. Gastroesophageal reflux disease (GERD) or other gastrointestinal conditions. Asthma or other breathing conditions. Restless legs syndrome, sleep apnea, or other sleep disorders. Chronic pain. Menopause. Stroke. Abuse of alcohol, tobacco, or illegal drugs. Mental health conditions, such as depression. Caffeine. Neurological disorders, such as Alzheimer's disease. An overactive thyroid (hyperthyroidism). Sometimes, the  cause of insomnia may not be known. What increases the risk? Risk factors for insomnia include: Gender. Women are affected more often than men. Age. Insomnia is more common as you get older. Stress. Lack of exercise. Irregular work schedule or working night shifts. Traveling between different time zones. Certain medical and mental health conditions. What are the signs or symptoms? If you have insomnia, the main symptom is having trouble falling asleep or having trouble staying asleep. This may lead to other symptoms, such as: Feeling fatigued or having low energy. Feeling nervous about going to sleep. Not feeling rested in the morning. Having trouble concentrating. Feeling irritable, anxious, or depressed. How is this diagnosed? This condition may be diagnosed based on: Your symptoms and medical history. Your health care provider may ask about: Your sleep habits. Any medical conditions you have. Your mental health. A physical exam. How is this treated? Treatment for insomnia depends on the cause. Treatment may focus on treating an underlying condition that is causing insomnia. Treatment may also include: Medicines to help you sleep. Counseling or therapy. Lifestyle adjustments to help you sleep better. Follow these instructions at home: Eating and drinking  Limit or avoid alcohol, caffeinated beverages, and cigarettes, especially close to bedtime. These can disrupt your sleep. Do not eat a large meal or eat spicy foods right before bedtime. This can lead to digestive discomfort that can make it hard for you to sleep.  Sleep habits  Keep a sleep diary to help you and your health care provider figure out what could be causing your insomnia. Write down: When you sleep. When you wake up during the night. How well you sleep. How rested you feel the  next day. Any side effects of medicines you are taking. What you eat and drink. Make your bedroom a dark, comfortable place where  it is easy to fall asleep. Put up shades or blackout curtains to block light from outside. Use a white noise machine to block noise. Keep the temperature cool. Limit screen use before bedtime. This includes: Watching TV. Using your smartphone, tablet, or computer. Stick to a routine that includes going to bed and waking up at the same times every day and night. This can help you fall asleep faster. Consider making a quiet activity, such as reading, part of your nighttime routine. Try to avoid taking naps during the day so that you sleep better at night. Get out of bed if you are still awake after 15 minutes of trying to sleep. Keep the lights down, but try reading or doing a quiet activity. When you feel sleepy, go back to bed.  General instructions Take over-the-counter and prescription medicines only as told by your health care provider. Exercise regularly, as told by your health care provider. Avoid exercise starting several hours before bedtime. Use relaxation techniques to manage stress. Ask your health care provider to suggest some techniques that may work well for you. These may include: Breathing exercises. Routines to release muscle tension. Visualizing peaceful scenes. Make sure that you drive carefully. Avoid driving if you feel very sleepy. Keep all follow-up visits as told by your health care provider. This is important. Contact a health care provider if: You are tired throughout the day. You have trouble in your daily routine due to sleepiness. You continue to have sleep problems, or your sleep problems get worse. Get help right away if: You have serious thoughts about hurting yourself or someone else. If you ever feel like you may hurt yourself or others, or have thoughts about taking your own life, get help right away. You can go to your nearest emergency department or call: Your local emergency services (911 in the [place] A suicide crisis helpline, such as the National  Suicide Prevention Lifeline at (803)652-1414. This is open 24 hours a day. Summary Insomnia is a sleep disorder that makes it difficult to fall asleep or stay asleep. Insomnia can be long-term (chronic) or short-term (acute). Treatment for insomnia depends on the cause. Treatment may focus on treating an underlying condition that is causing insomnia. Keep a sleep diary to help you and your health care provider figure out what could be causing your insomnia. This information is not intended to replace advice given to you by your health care provider. Make sure you discuss any questions you have with your healthcare provider. Document Revised: 09/09/2020 Document Reviewed: 09/09/2020 Elsevier Patient Education  2022 [employer].

## 2021-05-11 ENCOUNTER — Encounter: Payer: Self-pay | Admitting: Family Medicine

## 2021-05-11 ENCOUNTER — Other Ambulatory Visit: Payer: Self-pay

## 2021-05-11 ENCOUNTER — Ambulatory Visit (INDEPENDENT_AMBULATORY_CARE_PROVIDER_SITE_OTHER): Payer: 59 | Admitting: Family Medicine

## 2021-05-11 VITALS — BP 120/88 | HR 86 | Ht 64.0 in | Wt 141.4 lb

## 2021-05-11 DIAGNOSIS — E119 Type 2 diabetes mellitus without complications: Secondary | ICD-10-CM

## 2021-05-11 DIAGNOSIS — H8113 Benign paroxysmal vertigo, bilateral: Secondary | ICD-10-CM

## 2021-05-11 NOTE — Assessment & Plan Note (Signed)
Patient with symptoms of BPPV.  States that he can turn his head a certain way to make the room feel like it is spinning.  He has had this happen in the past but it self resolved.  Denies nausea or vomiting.  Denies any hearing loss.  I did discuss with him the Epley maneuver and gave him a handout talk about how to do this.  He is going to try this at home.  I also placed a referral for neurovestibular rehab.  No concerning symptoms at this time.  Follow-up as needed.

## 2021-05-11 NOTE — Addendum Note (Signed)
Addended by: Jackelyn Poling on: 05/11/2021 11:42 AM   Modules accepted: Orders

## 2021-05-22 NOTE — Progress Notes (Signed)
SUBJECTIVE:   CHIEF COMPLAINT / HPI:   Insomnia with concern for sleep apnea: 50 year old male presenting to discuss insomnia.  At his last appointment we discussed him keeping a diary of bedtimes and nights where he has difficulty sleeping. He did mention at the last appointment that he does snore on occasion and was going to ask his spouse if he has any symptoms of sleep apnea such as gasping for air or waking himself up from snoring as well as how often he snores.  Today he states his spouse says he does snore a lot. He states he falls asleep easily but wakes up easily and has difficulty going back to sleep. Often watches tv before bed. Gets in bed around 2am and wakes up around 7am. He feels very tired when he wakes up each morning.   BPPV: Still having some issues with this.  He has tried some of the Epley maneuver from the sheet but request getting in with neurovestibular rehab.  States that this still occurs when he turns his head fast from 1 side to the other and makes him feel as if the room is spinning.  Hypertension: States he did not take his medication today but states he is otherwise been compliant with it.  States he wanted to see how his pressure would like today without taking it.  Pressure elevated today 137/95.  Discussed with him that this may be partially related to sleep apnea if he does have that diagnosis which I think there is a likelihood for.  PERTINENT  PMH / PSH: History of hypertension  OBJECTIVE:   BP (!) 137/95   Pulse 99   Ht 5\' 4"  (1.626 m)   Wt 144 lb 3.2 oz (65.4 kg)   SpO2 96%   BMI 24.75 kg/m    General: NAD, pleasant, able to participate in exam Cardiac: RRR, no murmurs. Respiratory: CTAB, normal effort Neuro: CN II through XII intact, fine touch sensation intact in upper and lower extremities bilaterally, strength 5/5 in upper and lower extremities bilaterally Psych: Normal affect and mood   ASSESSMENT/PLAN:   Benign paroxysmal positional  vertigo due to bilateral vestibular disorder Assessment: 50 year old male with a sensation of the room spinning when he turns his head quickly from one side to the other.  At the previous appointment we discussed the Epley maneuver and how to do that on himself.  He request getting in with neurovestibular rehab.  Normal neurologic exam in office today.  Most consistent with BPPV.  Patient is not on any medications that should predispose him to dizziness and is blood pressures are not low so I do not believe this is a product of orthostatic hypotension.  The fact that the room seems to spin further suggest BPPV as well as the fact that he can bring it on himself by turning his head a certain way.  Essential hypertension Blood pressure elevated 137/95 today.  The patient did not take his medication this morning to see if his blood pressure would be higher.  He has otherwise been compliant with the medication.  He normally takes lisinopril-hydrochlorothiazide.  Recommend follow-up in 1 week for blood pressure check as well as maintaining normal compliance with the medication.  Insomnia Strong concern for sleep apnea.  The patient states the spouse says he does snore.  He is able to fall asleep easily but wakes up multiple times through the night and often has trouble going back to sleep.  He does feel  very tired when wakes up in the morning.  He also tends to watch a lot of TV right before bed and gets in bed very late around 2 AM waking up around 7 or 8 AM.  Recommended sleep hygiene and provided him with a handout discussing some of these techniques.  Also recommended getting a sleep study as he does have some concerning signs for sleep apnea and this may also help his hypertension.  I have placed an order for the sleep study.  Follow-up once sleep study has been completed.     Jackelyn Poling, DO [facility]

## 2021-05-23 ENCOUNTER — Ambulatory Visit (INDEPENDENT_AMBULATORY_CARE_PROVIDER_SITE_OTHER): Payer: 59 | Admitting: Family Medicine

## 2021-05-23 ENCOUNTER — Other Ambulatory Visit: Payer: Self-pay

## 2021-05-23 ENCOUNTER — Encounter: Payer: Self-pay | Admitting: Family Medicine

## 2021-05-23 VITALS — BP 137/95 | HR 99 | Ht 64.0 in | Wt 144.2 lb

## 2021-05-23 DIAGNOSIS — G4700 Insomnia, unspecified: Secondary | ICD-10-CM | POA: Diagnosis not present

## 2021-05-23 DIAGNOSIS — H8113 Benign paroxysmal vertigo, bilateral: Secondary | ICD-10-CM | POA: Diagnosis not present

## 2021-05-23 DIAGNOSIS — I10 Essential (primary) hypertension: Secondary | ICD-10-CM

## 2021-05-23 NOTE — Patient Instructions (Signed)
I sent a referral for physical therapy for vestibular rehab for your dizziness.  If they do not call you in the next 1 week please let me know.  For your blood pressure I would like for you to check your pressure for the next 1 week and make a follow-up appointment in about 1 week.  Please continue to take your blood pressure medicine so we can make sure it is at a good level.  I have sent a referral for a sleep study.  They should contact you in the next 1 to 2 weeks to set up a sleep study so they can evaluate you while you sleep and see if you have sleep apnea.  If you do this can improve your sleeping.   Insomnia Insomnia is a sleep disorder that makes it difficult to fall asleep or stay asleep. Insomnia can cause fatigue, low energy, difficulty concentrating, moodswings, and poor performance at work or school. There are three different ways to classify insomnia: Difficulty falling asleep. Difficulty staying asleep. Waking up too early in the morning. Any type of insomnia can be long-term (chronic) or short-term (acute). Both are common. Short-term insomnia usually lasts for three months or less. Chronic insomnia occurs at least three times a week for longer than threemonths. What are the causes? Insomnia may be caused by another condition, situation, or substance, such as: Anxiety. Certain medicines. Gastroesophageal reflux disease (GERD) or other gastrointestinal conditions. Asthma or other breathing conditions. Restless legs syndrome, sleep apnea, or other sleep disorders. Chronic pain. Menopause. Stroke. Abuse of alcohol, tobacco, or illegal drugs. Mental health conditions, such as depression. Caffeine. Neurological disorders, such as Alzheimer's disease. An overactive thyroid (hyperthyroidism). Sometimes, the cause of insomnia may not be known. What increases the risk? Risk factors for insomnia include: Gender. Women are affected more often than men. Age. Insomnia is more  common as you get older. Stress. Lack of exercise. Irregular work schedule or working night shifts. Traveling between different time zones. Certain medical and mental health conditions. What are the signs or symptoms? If you have insomnia, the main symptom is having trouble falling asleep or having trouble staying asleep. This may lead to other symptoms, such as: Feeling fatigued or having low energy. Feeling nervous about going to sleep. Not feeling rested in the morning. Having trouble concentrating. Feeling irritable, anxious, or depressed. How is this diagnosed? This condition may be diagnosed based on: Your symptoms and medical history. Your health care provider may ask about: Your sleep habits. Any medical conditions you have. Your mental health. A physical exam. How is this treated? Treatment for insomnia depends on the cause. Treatment may focus on treating an underlying condition that is causing insomnia. Treatment may also include: Medicines to help you sleep. Counseling or therapy. Lifestyle adjustments to help you sleep better. Follow these instructions at home: Eating and drinking  Limit or avoid alcohol, caffeinated beverages, and cigarettes, especially close to bedtime. These can disrupt your sleep. Do not eat a large meal or eat spicy foods right before bedtime. This can lead to digestive discomfort that can make it hard for you to sleep.  Sleep habits  Keep a sleep diary to help you and your health care provider figure out what could be causing your insomnia. Write down: When you sleep. When you wake up during the night. How well you sleep. How rested you feel the next day. Any side effects of medicines you are taking. What you eat and drink. Make your bedroom  a dark, comfortable place where it is easy to fall asleep. Put up shades or blackout curtains to block light from outside. Use a white noise machine to block noise. Keep the temperature cool. Limit  screen use before bedtime. This includes: Watching TV. Using your smartphone, tablet, or computer. Stick to a routine that includes going to bed and waking up at the same times every day and night. This can help you fall asleep faster. Consider making a quiet activity, such as reading, part of your nighttime routine. Try to avoid taking naps during the day so that you sleep better at night. Get out of bed if you are still awake after 15 minutes of trying to sleep. Keep the lights down, but try reading or doing a quiet activity. When you feel sleepy, go back to bed.  General instructions Take over-the-counter and prescription medicines only as told by your health care provider. Exercise regularly, as told by your health care provider. Avoid exercise starting several hours before bedtime. Use relaxation techniques to manage stress. Ask your health care provider to suggest some techniques that may work well for you. These may include: Breathing exercises. Routines to release muscle tension. Visualizing peaceful scenes. Make sure that you drive carefully. Avoid driving if you feel very sleepy. Keep all follow-up visits as told by your health care provider. This is important. Contact a health care provider if: You are tired throughout the day. You have trouble in your daily routine due to sleepiness. You continue to have sleep problems, or your sleep problems get worse. Get help right away if: You have serious thoughts about hurting yourself or someone else. If you ever feel like you may hurt yourself or others, or have thoughts about taking your own life, get help right away. You can go to your nearest emergency department or call: Your local emergency services (911 in the [place] A suicide crisis helpline, such as the National Suicide Prevention Lifeline at 870-074-2435. This is open 24 hours a day. Summary Insomnia is a sleep disorder that makes it difficult to fall asleep or stay  asleep. Insomnia can be long-term (chronic) or short-term (acute). Treatment for insomnia depends on the cause. Treatment may focus on treating an underlying condition that is causing insomnia. Keep a sleep diary to help you and your health care provider figure out what could be causing your insomnia. This information is not intended to replace advice given to you by your health care provider. Make sure you discuss any questions you have with your healthcare provider. Document Revised: 09/09/2020 Document Reviewed: 09/09/2020 Elsevier Patient Education  2022 [employer].

## 2021-05-23 NOTE — Assessment & Plan Note (Signed)
Blood pressure elevated 137/95 today.  The patient did not take his medication this morning to see if his blood pressure would be higher.  He has otherwise been compliant with the medication.  He normally takes lisinopril-hydrochlorothiazide.  Recommend follow-up in 1 week for blood pressure check as well as maintaining normal compliance with the medication.

## 2021-05-23 NOTE — Assessment & Plan Note (Signed)
Assessment: 50 year old male with a sensation of the room spinning when he turns his head quickly from one side to the other.  At the previous appointment we discussed the Epley maneuver and how to do that on himself.  He request getting in with neurovestibular rehab.  Normal neurologic exam in office today.  Most consistent with BPPV.  Patient is not on any medications that should predispose him to dizziness and is blood pressures are not low so I do not believe this is a product of orthostatic hypotension.  The fact that the room seems to spin further suggest BPPV as well as the fact that he can bring it on himself by turning his head a certain way.

## 2021-05-23 NOTE — Assessment & Plan Note (Addendum)
Strong concern for sleep apnea.  The patient states the spouse says he does snore.  He is able to fall asleep easily but wakes up multiple times through the night and often has trouble going back to sleep.  He does feel very tired when wakes up in the morning.  He also tends to watch a lot of TV right before bed and gets in bed very late around 2 AM waking up around 7 or 8 AM.  Recommended sleep hygiene and provided him with a handout discussing some of these techniques.  Also recommended getting a sleep study as he does have some concerning signs for sleep apnea and this may also help his hypertension.  I have placed an order for the sleep study.  Follow-up once sleep study has been completed.

## 2021-05-25 ENCOUNTER — Other Ambulatory Visit: Payer: Self-pay

## 2021-05-25 ENCOUNTER — Ambulatory Visit
Admission: RE | Admit: 2021-05-25 | Discharge: 2021-05-25 | Disposition: A | Discharge status: Home / Self Care | Payer: 59 | Source: Ambulatory Visit | Attending: Internal Medicine | Admitting: Internal Medicine

## 2021-05-25 DIAGNOSIS — Z9189 Other specified personal risk factors, not elsewhere classified: Secondary | ICD-10-CM

## 2021-05-25 DIAGNOSIS — B181 Chronic viral hepatitis B without delta-agent: Secondary | ICD-10-CM

## 2021-05-30 ENCOUNTER — Telehealth: Payer: Self-pay

## 2021-05-30 NOTE — Telephone Encounter (Signed)
Spoke with patient with the help of [place] interpreter to relay results per Dr. Luciana Axe that his ultrasound looks fine and there are no new concerns. Patient verbalized understanding and has no further questions.   Sandie Ano, RN

## 2021-05-30 NOTE — Telephone Encounter (Signed)
-----   Message from Gardiner Barefoot, MD sent at 05/30/2021  4:12 PM EDT ----- Please let him know his ultrasound looks fine, no new concerns.   thanks

## 2021-06-15 ENCOUNTER — Telehealth: Payer: Self-pay | Admitting: *Deleted

## 2021-06-15 DIAGNOSIS — G4700 Insomnia, unspecified: Secondary | ICD-10-CM

## 2021-06-15 NOTE — Telephone Encounter (Signed)
Received fax from  sleep center that [facility] has denied an onsite sleep study.  They may cover an at home study based off of the reason for denial.  Please sign off on orders pended.  Patient has been notified by the center of denial.  Thanks Nichola Warren,CMA

## 2021-06-16 ENCOUNTER — Ambulatory Visit: Payer: 59 | Admitting: Family Medicine

## 2021-06-20 ENCOUNTER — Other Ambulatory Visit: Payer: Self-pay

## 2021-06-20 ENCOUNTER — Encounter: Payer: Self-pay | Admitting: Family Medicine

## 2021-06-20 ENCOUNTER — Ambulatory Visit (INDEPENDENT_AMBULATORY_CARE_PROVIDER_SITE_OTHER): Payer: 59 | Admitting: Family Medicine

## 2021-06-20 DIAGNOSIS — G4700 Insomnia, unspecified: Secondary | ICD-10-CM | POA: Diagnosis not present

## 2021-06-20 DIAGNOSIS — I10 Essential (primary) hypertension: Secondary | ICD-10-CM | POA: Diagnosis not present

## 2021-06-20 MED ORDER — LISINOPRIL-HYDROCHLOROTHIAZIDE 10-12.5 MG PO TABS: 1.0000 | ORAL_TABLET | Freq: Every day | ORAL | 3 refills | Status: DC

## 2021-06-20 NOTE — Progress Notes (Signed)
    SUBJECTIVE:   CHIEF COMPLAINT / HPI:   Hypertension follow-up: 50 year old male presenting for hypertension follow-up.  At last appointment blood pressure was 137/95.  Patient continues on lisinopril-hydrochlorothiazide. He just left from the gym upon coming here and so his BP is elevated.  He states he did check his blood pressure at home and the top number was 125 but he did not remember what the bottom number was.  He states he does have access to a blood pressure cuff at home.  Insomnia: Patient previously discussed concerns for insomnia. He had stated that he snores and had concern for sleep apnea.  Sleep study had been ordered, however the patient's insurance denied this.  Because of this we ordered a home sleep study last week which is still pending.  PERTINENT  PMH / PSH: HTN  OBJECTIVE:   BP (!) 130/91   Pulse 91   Wt 141 lb 8 oz (64.2 kg)   SpO2 99%   BMI 24.29 kg/m    General: NAD, pleasant, able to participate in exam Cardiac: RRR, no murmurs. Respiratory: CTAB, normal effort Psych: Normal affect and mood   ASSESSMENT/PLAN:   Essential hypertension BP today of 130/91 however patient just came from the gym. He endorses a BP of 125 systolic at home but doesn't remember the diastolic.  Currently on lisinopril-hydrochlorothiazide 10-12.5 daily. Willl have patient measures blood pressures at home and send a MyChart message.  I have sent him a MyChart message as a reminder which she will reply back to with 3 blood pressure measurements to determine if we should increase his medication.  If it remains elevated we will increase his lisinopril-hydrochlorothiazide to 20-25 mg daily.   Insomnia Insurance denied sleep study to evaluate for sleep apnea.  Attempted to order home sleep study to evaluate for sleep apnea.  This is still pending.  Continue working on sleep hygiene techniques.   Jackelyn Poling, DO [facility]

## 2021-06-20 NOTE — Patient Instructions (Signed)
It was great to see you! Thank you for allowing me to participate in your care!  I recommend that you always bring your medications to each appointment as this makes it easy to ensure we are on the correct medications and helps [place] not miss when refills are needed.  Our plans for today:  -I would like for you to send me 3 blood pressure measurements through MyChart over the next few days.  Please take these while resting for at least 5 minutes in a relaxed state.  I recommend that each time you check your blood pressure take a second 1 shortly after and average these numbers for the most accurate measurement.  Once I get those 3 measurements from you we will decide if we should increase her medication or not. -I will let you know when I hear more about the home sleep study to evaluate for sleep apnea. -I have sent in refills for your blood pressure medications.  Take care and seek immediate care sooner if you develop any concerns.   Dr. Jackelyn Poling, DO [facility]

## 2021-06-20 NOTE — Assessment & Plan Note (Signed)
BP today of 130/91 however patient just came from the gym. He endorses a BP of 125 systolic at home but doesn't remember the diastolic.  Currently on lisinopril-hydrochlorothiazide 10-12.5 daily. Willl have patient measures blood pressures at home and send a MyChart message.  I have sent him a MyChart message as a reminder which she will reply back to with 3 blood pressure measurements to determine if we should increase his medication.  If it remains elevated we will increase his lisinopril-hydrochlorothiazide to 20-25 mg daily.

## 2021-06-20 NOTE — Assessment & Plan Note (Signed)
Insurance denied sleep study to evaluate for sleep apnea.  Attempted to order home sleep study to evaluate for sleep apnea.  This is still pending.  Continue working on sleep hygiene techniques.

## 2021-08-01 ENCOUNTER — Encounter (HOSPITAL_BASED_OUTPATIENT_CLINIC_OR_DEPARTMENT_OTHER): Payer: 59 | Admitting: Internal Medicine

## 2021-09-26 ENCOUNTER — Ambulatory Visit (INDEPENDENT_AMBULATORY_CARE_PROVIDER_SITE_OTHER): Payer: 59

## 2021-09-26 ENCOUNTER — Other Ambulatory Visit: Payer: Self-pay

## 2021-09-26 ENCOUNTER — Encounter: Payer: Self-pay | Admitting: Internal Medicine

## 2021-09-26 ENCOUNTER — Ambulatory Visit (INDEPENDENT_AMBULATORY_CARE_PROVIDER_SITE_OTHER): Payer: 59 | Admitting: Internal Medicine

## 2021-09-26 VITALS — BP 122/86 | HR 88 | Temp 97.9°F

## 2021-09-26 DIAGNOSIS — Z23 Encounter for immunization: Secondary | ICD-10-CM

## 2021-09-26 DIAGNOSIS — Z9189 Other specified personal risk factors, not elsewhere classified: Secondary | ICD-10-CM | POA: Diagnosis not present

## 2021-09-26 DIAGNOSIS — K7400 Hepatic fibrosis, unspecified: Secondary | ICD-10-CM | POA: Diagnosis not present

## 2021-09-26 DIAGNOSIS — B181 Chronic viral hepatitis B without delta-agent: Secondary | ICD-10-CM | POA: Diagnosis not present

## 2021-09-26 NOTE — Progress Notes (Signed)
   Subjective:    Patient ID: Brian Preston, male    DOB: 06-18-1971, 50 y.o.   MRN: [id number]  HPI Here for follow up of chronic inactive hepatitis B He has a history of cirrhosis and is E Ag negative with a previous viral load of 713,000 in 2015.  His most recent viral load was <10 and his surface Ag is now negative x the last two checks.  Recent ultrasound is without concerns.     Review of Systems  Constitutional:  Negative for unexpected weight change.  Gastrointestinal:  Negative for diarrhea and nausea.  Skin:  Negative for rash.      Objective:   Physical Exam Pulmonary:     Effort: Pulmonary effort is normal.  Skin:    Findings: No rash.  Neurological:     General: No focal deficit present.     Mental Status: He is alert.  Psychiatric:        Mood and Affect: Mood normal.   SH: no alcohol       Assessment & Plan:

## 2021-09-26 NOTE — Progress Notes (Signed)
   Covid-19 Vaccination Clinic  Name:  Brian Preston    MRN: [id number] DOB: [birth year]  09/26/2021  Mr. Brian Preston was observed post Covid-19 immunization for 15 minutes without incident. He was provided with Vaccine Information Sheet and instruction to access the V-Safe system.   Mr. Brian Preston was instructed to call 911 with any severe reactions post vaccine: Difficulty breathing  Swelling of face and throat  A fast heartbeat  A bad rash all over body  Dizziness and weakness   Immunizations Administered     Name Date Dose VIS Date Route   Pfizer Covid-19 Vaccine Bivalent Booster 09/26/2021 10:24 AM 0.3 mL 07/13/2021 Intramuscular   Manufacturer: [employer], [employer]   Lot: [id number]   NDC: [address]-4      Sandie Ano, RN

## 2021-09-26 NOTE — Assessment & Plan Note (Signed)
He does have cirrhosis and will continue to monitor for [facility] with ultrasound twice a year ongoing, even with resolution of the hepatitis B, it remains indicated.

## 2021-09-26 NOTE — Assessment & Plan Note (Signed)
Inactive state with normal transaminases and virus undetected.  With the surface Ag now negative, he seems to be resolving.  I will check his DNA today, the surface Ag again and also the Ab.  If negative, seems to have resolved though he will still be at risk of relapse infection if he becomes immunosuppressed for any reason and so will need to continue to monitor.

## 2021-09-26 NOTE — Assessment & Plan Note (Signed)
Looks to have early cirrhosis. He has been to [facility] in the past.  May need an EGD and he will be due soon for screening colonoscopy.  Will refer after his next visit.

## 2021-09-28 LAB — HEPATITIS B DNA, ULTRAQUANTITATIVE, PCR
Hepatitis B DNA (Calc): <1 Log IU/mL — ABNORMAL HIGH
Hepatitis B DNA: <10 IU/mL — ABNORMAL HIGH

## 2021-09-28 LAB — HEPATITIS B SURFACE ANTIGEN: Hepatitis B Surface Ag: NONREACTIVE

## 2021-09-28 LAB — HEPATITIS B E ANTIGEN: Hep B E Ag: NONREACTIVE

## 2021-09-28 LAB — HEPATITIS B E ANTIBODY: Hep B E Ab: REACTIVE — AB

## 2021-09-28 LAB — HEPATITIS B CORE ANTIBODY, TOTAL: Hep B Core Total Ab: REACTIVE — AB

## 2021-09-28 LAB — HEPATITIS B SURFACE ANTIBODY,QUALITATIVE: Hep B S Ab: REACTIVE — AB

## 2021-12-06 ENCOUNTER — Other Ambulatory Visit: Payer: 59

## 2022-01-17 ENCOUNTER — Ambulatory Visit (INDEPENDENT_AMBULATORY_CARE_PROVIDER_SITE_OTHER): Payer: Self-pay | Admitting: Family Medicine

## 2022-01-17 ENCOUNTER — Other Ambulatory Visit: Payer: Self-pay

## 2022-01-17 VITALS — BP 128/68 | HR 94 | Ht 64.0 in | Wt 143.6 lb

## 2022-01-17 DIAGNOSIS — M7711 Lateral epicondylitis, right elbow: Secondary | ICD-10-CM

## 2022-01-17 DIAGNOSIS — I10 Essential (primary) hypertension: Secondary | ICD-10-CM

## 2022-01-17 MED ORDER — MELOXICAM 15 MG PO TABS
15.0000 mg | ORAL_TABLET | Freq: Every day | ORAL | 0 refills | Status: DC
Start: 2022-01-17 — End: 2022-02-07

## 2022-01-17 MED ORDER — LISINOPRIL-HYDROCHLOROTHIAZIDE 10-12.5 MG PO TABS: 1.0000 | ORAL_TABLET | Freq: Every day | ORAL | 3 refills | Status: DC

## 2022-01-17 NOTE — Progress Notes (Signed)
? ? ?  SUBJECTIVE:  ? ?CHIEF COMPLAINT / HPI:  ? ?Right arm and hand pain ?- 2 months duration ?- pain with sweeping and straightening arm ?- aleve and stretching helps but pain returns the next day ?- no hand weakness. 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 ?PERTINENT  PMH / PSH:  ?Patient Active Problem List  ? Diagnosis Date Noted  ? Lateral epicondylitis of right elbow 01/17/2022  ? Insomnia 05/23/2021  ? Benign paroxysmal positional vertigo due to bilateral vestibular disorder 05/11/2021  ? Vertigo 04/06/2021  ? At risk for cancer 03/31/2020  ? Sprain of medial collateral ligament of left knee 07/22/2018  ? Liver fibrosis 05/15/2017  ? Essential hypertension 04/04/2017  ? Elevated TSH 11/27/2016  ? Chronic hepatitis B ([place]) 11/27/2016  ?  ?OBJECTIVE:  ? ?BP 128/68   Pulse 94   Ht 5\' 4"  (1.626 m)   Wt 143 lb 9.6 oz (65.1 kg)   SpO2 100%   BMI 24.65 kg/m?   ?PHQ-9:  ?Depression screen [facility] 2/9 01/17/2022 09/26/2021 05/23/2021  ?Decreased Interest 0 0 0  ?Down, Depressed, Hopeless 0 0 2  ?PHQ - 2 Score 0 0 2  ?Altered sleeping 1 - 3  ?Tired, decreased energy 1 - 2  ?Change in appetite 0 - 2  ?Feeling bad or failure about yourself  0 - -  ?Trouble concentrating 0 - 0  ?Moving slowly or fidgety/restless 0 - 0  ?Suicidal thoughts 0 - 0  ?PHQ-9 Score 2 - 9  ?Difficult doing work/chores Not difficult at all - -  ?  ?GAD-7: No flowsheet data found. ?  ?Physical Exam ?General: Awake, alert, oriented, no acute distress ?Respiratory: Unlabored respirations, speaking in full sentences, no respiratory distress ?Extremities: Moving all extremities spontaneously, right arm and elbow without deformity or swelling, grip strength 5/5 and equal bilaterally, BUE strength 5/5 and equal bilaterally ? ?ASSESSMENT/PLAN:  ? ?Lateral epicondylitis of right elbow ?Subacute, 2 month duration. Also considered bursitis, fracture, and tendonopathy. No  red flags or neuro deficits on exam. Conservative measures discussed, including elbow strap. Rx meloxicam 15 mg daily x 14 days. Return precautions discussed.  ?  ? ? ?Ezequiel Essex, MD ?[place]  ?

## 2022-01-17 NOTE — Assessment & Plan Note (Signed)
Subacute, 2 month duration. Also considered bursitis, fracture, and tendonopathy. No red flags or neuro deficits on exam. Conservative measures discussed, including elbow strap. Rx meloxicam 15 mg daily x 14 days. Return precautions discussed.  ?

## 2022-01-17 NOTE — Patient Instructions (Signed)
It was wonderful to meet you today. Thank you for allowing me to be a part of your care. Below is a short summary of what we discussed at your visit today: ? ?Tennis Elbow ?I believe your arm pain is from something commonly called tennis elbow.  ?I have called in a once-daily anti-inflammatory medication called Meloxicam. Take one every day for 14 days.  ?Rest your arm and place ice over the area if it is still hurting with rest.  ?You may get one of the following elbow straps over the counter. It may help to relieve pain when you work.  ?See the following hand out for more information on this condition and stretches to help relieve. ? ? ?Physical exam ?Please schedule your full physical and lab work appointment with your PCP, Dr. Lurline Del. You may schedule this at the front.  ? ? ? ?Please bring all of your medications to every appointment! ? ?If you have any questions or concerns, please do not hesitate to contact us via phone or MyChart message.  ? ?Ezequiel Essex, MD  ?

## 2022-01-24 NOTE — Progress Notes (Signed)
? ? ?  SUBJECTIVE:  ? ?CHIEF COMPLAINT / HPI:  ? ?Annual physical: ?History: Chronic hep B, continues to follow with infectious disease regularly. Has upcoming visit in next 6 months. ?Hypertension: on lisinoril-hctz 10-12.5, no missed doses. ? ?Alcohol: rare occasion ?Tobacco: previous smoker, quit about 30 years ago, smoked 4 years, less than 1 ppd. ?Illicit drugs: none ?Exercise: previously did but stopped due to work schedule ? ?Patient request PSA screening.  He states he previously had a friend in his neighborhood that died of cancer and request to have this screened.  He denies any family history of prostate cancer. ? ?PERTINENT  PMH / PSH: Chronic hepatitis B ? ?OBJECTIVE:  ? ?BP 125/80   Pulse 80   Ht 5\' 4"  (1.626 m)   Wt 144 lb 3.2 oz (65.4 kg)   SpO2 98%   BMI 24.75 kg/m?   ? ?General: NAD, pleasant, able to participate in exam ?HEENT: No pharyngeal erythema, no cervical lymphadenopathy ?Cardiac: RRR, no murmurs. ?Respiratory: CTAB, normal effort, No wheezes, rales or rhonchi ?Abdomen: Bowel sounds present, nontender, nondistended, no hepatosplenomegaly. ?Extremities: no edema or cyanosis. ?Skin: warm and dry, no rashes noted ?Neuro: alert, no obvious focal deficits ?Psych: Normal affect and mood ? ?ASSESSMENT/PLAN:  ? ?  ?Screening for prostate cancer: ?We will check PSA.  Discussed risk and benefits of this prior to the screening.  Patient wishes to proceed.  No family history of prostate cancer. ? ?Hyperlipidemia: ?Previous LDL of 125 last year.  We will check lipid panel today. ? ?Hypertension: ?Blood pressure well controlled, continues on lisinopril-hydrochlorothiazide.  We will check a BMP today. ? ?Chronic hepatitis B: ?Follows with infectious disease.  States he sees them every 6 to 12 months.  Has an upcoming appointment with them.  No concerns today.  No hepatosplenomegaly on physical exam.  Recommend continue follow-up with infectious disease. ? ? , DO ?[facility]  ? ? ? ?

## 2022-01-25 ENCOUNTER — Encounter: Payer: Self-pay | Admitting: Family Medicine

## 2022-01-25 ENCOUNTER — Ambulatory Visit (INDEPENDENT_AMBULATORY_CARE_PROVIDER_SITE_OTHER): Payer: 59 | Admitting: Family Medicine

## 2022-01-25 ENCOUNTER — Other Ambulatory Visit: Payer: Self-pay

## 2022-01-25 VITALS — BP 125/80 | HR 80 | Ht 64.0 in | Wt 144.2 lb

## 2022-01-25 DIAGNOSIS — Z0000 Encounter for general adult medical examination without abnormal findings: Secondary | ICD-10-CM

## 2022-01-25 DIAGNOSIS — Z125 Encounter for screening for malignant neoplasm of prostate: Secondary | ICD-10-CM | POA: Diagnosis not present

## 2022-01-25 DIAGNOSIS — I10 Essential (primary) hypertension: Secondary | ICD-10-CM

## 2022-01-25 DIAGNOSIS — E785 Hyperlipidemia, unspecified: Secondary | ICD-10-CM

## 2022-01-25 MED ORDER — LISINOPRIL-HYDROCHLOROTHIAZIDE 10-12.5 MG PO TABS
1.0000 | ORAL_TABLET | Freq: Every day | ORAL | 3 refills | Status: DC
Start: 2022-01-25 — End: 2023-03-20

## 2022-01-25 NOTE — Patient Instructions (Signed)
Continue working on diet and exercise as we discussed.  I recommend getting at least 3 days of exercise per week focusing on at least 20 minutes/day.  If you can do more than this it is even better. ? ?Continue following with your infectious disease specialist.  Let me know if you have any questions or concerns. ? ?We are going to check some blood work today and I will let you know the results.  I have provided refills for your blood pressure medication. ?

## 2022-01-26 LAB — BASIC METABOLIC PANEL
BUN/Creatinine Ratio: 18 (ref 9–20)
BUN: 15 mg/dL (ref 6–24)
CO2: 28 mmol/L (ref 20–29)
Calcium: 9.6 mg/dL (ref 8.7–10.2)
Chloride: 99 mmol/L (ref 96–106)
Creatinine, Ser: 0.85 mg/dL (ref 0.76–1.27)
Glucose: 78 mg/dL (ref 70–99)
Potassium: 4.6 mmol/L (ref 3.5–5.2)
Sodium: 137 mmol/L (ref 134–144)
eGFR: 106 mL/min/{1.73_m2} (ref >59)

## 2022-01-26 LAB — LIPID PANEL
Chol/HDL Ratio: 3.8 ratio (ref 0.0–5.0)
Cholesterol, Total: 184 mg/dL (ref 100–199)
HDL: 48 mg/dL (ref >39)
LDL Chol Calc (NIH): 110 mg/dL — ABNORMAL HIGH (ref 0–99)
Triglycerides: 148 mg/dL (ref 0–149)
VLDL Cholesterol Cal: 26 mg/dL (ref 5–40)

## 2022-01-26 LAB — PSA: Prostate Specific Ag, Serum: 0.6 ng/mL (ref 0.0–4.0)

## 2022-02-06 ENCOUNTER — Telehealth: Payer: Self-pay

## 2022-02-06 NOTE — Telephone Encounter (Signed)
Daughter calls nurse line requesting a refill on Meloxicam.  ? ?I do not see this on current medication list. Daughter reports he is still in some pain and Meloxicam helped.  ? ?Will forward to PCP.  ?

## 2022-02-07 ENCOUNTER — Other Ambulatory Visit: Payer: Self-pay | Admitting: Family Medicine

## 2022-02-07 DIAGNOSIS — M7711 Lateral epicondylitis, right elbow: Secondary | ICD-10-CM

## 2022-02-07 MED ORDER — MELOXICAM 15 MG PO TABS: 15.0000 mg | ORAL_TABLET | Freq: Every day | ORAL | 0 refills | Status: AC

## 2022-02-07 NOTE — Progress Notes (Signed)
Spoke to patient about his refill request for meloxicam. Discussed that taking it long term would increase his risk of things such as stomach ulcers. He is understanding of this and would like to do 1-2 more weeks for lateral epicondylitis. I think this is reasonable and will send in. ?

## 2022-04-04 ENCOUNTER — Other Ambulatory Visit: Payer: Self-pay

## 2022-04-04 ENCOUNTER — Encounter: Payer: Self-pay | Admitting: Internal Medicine

## 2022-04-04 ENCOUNTER — Ambulatory Visit: Payer: 59 | Admitting: Internal Medicine

## 2022-04-04 VITALS — BP 123/84 | HR 85 | Resp 16 | Ht 64.0 in | Wt 142.0 lb

## 2022-04-04 DIAGNOSIS — B1910 Unspecified viral hepatitis B without hepatic coma: Secondary | ICD-10-CM | POA: Diagnosis not present

## 2022-04-04 DIAGNOSIS — B181 Chronic viral hepatitis B without delta-agent: Secondary | ICD-10-CM

## 2022-04-04 DIAGNOSIS — Z9189 Other specified personal risk factors, not elsewhere classified: Secondary | ICD-10-CM | POA: Diagnosis not present

## 2022-04-04 DIAGNOSIS — K7460 Unspecified cirrhosis of liver: Secondary | ICD-10-CM

## 2022-04-04 NOTE — Assessment & Plan Note (Signed)
Results were discussed with him.  It is inactive and his labs suggest that he is converting to resolution of infection.  I will recheck his labs today and have him return again in about 6 months.

## 2022-04-04 NOTE — Progress Notes (Signed)
   Subjective:    Patient ID: Brian Preston, male    DOB: 05-15-1971, 51 y.o.   MRN: [id number]  HPI He is here for follow up of chronic inactive hepatitis B He is doing well with no new concerns.  His last lab results 6 months ago was significant for a negative hepatitis B surface Ag and a new positive hepatitis B surface Ab.  DNA results remained at < 10.  He did not get a follow up ultrasound as recommended.    Review of Systems  Constitutional:  Negative for fatigue.  Gastrointestinal:  Negative for diarrhea and nausea.  Skin:  Negative for rash.      Objective:   Physical Exam Eyes:     General: No scleral icterus. Skin:    Findings: No rash.  Neurological:     General: No focal deficit present.     Mental Status: He is alert.  Psychiatric:        Mood and Affect: Mood normal.   SH: occasional alcohol       Assessment & Plan:

## 2022-04-04 NOTE — Assessment & Plan Note (Addendum)
MRI c/w cirrhosis. I will refer him to GI for consideration of EGD as indicated.   He has previously been to [facility].

## 2022-04-04 NOTE — Assessment & Plan Note (Signed)
Will continue with HCC screening.  I emphasized the importance of this.

## 2022-04-07 ENCOUNTER — Ambulatory Visit (HOSPITAL_COMMUNITY)
Admission: RE | Admit: 2022-04-07 | Discharge: 2022-04-07 | Disposition: A | Discharge status: Home / Self Care | Payer: 59 | Source: Ambulatory Visit | Attending: Internal Medicine | Admitting: Internal Medicine

## 2022-04-07 DIAGNOSIS — K7460 Unspecified cirrhosis of liver: Secondary | ICD-10-CM | POA: Diagnosis present

## 2022-04-07 DIAGNOSIS — Z9189 Other specified personal risk factors, not elsewhere classified: Secondary | ICD-10-CM | POA: Diagnosis present

## 2022-04-07 DIAGNOSIS — B181 Chronic viral hepatitis B without delta-agent: Secondary | ICD-10-CM | POA: Diagnosis not present

## 2022-04-07 DIAGNOSIS — B1910 Unspecified viral hepatitis B without hepatic coma: Secondary | ICD-10-CM | POA: Insufficient documentation

## 2022-04-08 LAB — HEPATIC FUNCTION PANEL
AG Ratio: 1.5 (calc) (ref 1.0–2.5)
ALT: 15 U/L (ref 9–46)
AST: 13 U/L (ref 10–35)
Albumin: 4.4 g/dL (ref 3.6–5.1)
Alkaline phosphatase (APISO): 50 U/L (ref 35–144)
Bilirubin, Direct: 0.1 mg/dL (ref 0.0–0.2)
Globulin: 3 g/dL (calc) (ref 1.9–3.7)
Indirect Bilirubin: 0.2 mg/dL (calc) (ref 0.2–1.2)
Total Bilirubin: 0.3 mg/dL (ref 0.2–1.2)
Total Protein: 7.4 g/dL (ref 6.1–8.1)

## 2022-04-08 LAB — HEPATITIS B DNA, ULTRAQUANTITATIVE, PCR
Hepatitis B DNA (Calc): <1 Log IU/mL
Hepatitis B DNA: <10 IU/mL

## 2022-04-08 LAB — HEPATITIS B SURFACE ANTIGEN: Hepatitis B Surface Ag: NONREACTIVE

## 2022-05-31 ENCOUNTER — Encounter: Payer: Self-pay | Admitting: Internal Medicine

## 2022-09-26 ENCOUNTER — Ambulatory Visit: Payer: Self-pay | Admitting: Internal Medicine

## 2022-10-04 ENCOUNTER — Ambulatory Visit (HOSPITAL_COMMUNITY): Admission: RE | Admit: 2022-10-04 | Payer: 59 | Source: Ambulatory Visit

## 2022-11-21 IMAGING — US US ABDOMEN LIMITED
1 series · 14 of 25 positions shown · non-contrast
Comparison: 10/20/2020

CLINICAL DATA: Hepatitis-B

EXAM:
ULTRASOUND ABDOMEN LIMITED RIGHT UPPER QUADRANT

[Series 1: us abdomen limited · 0.11mm/px · 14 of 42 slices shown]
[im 1/42]
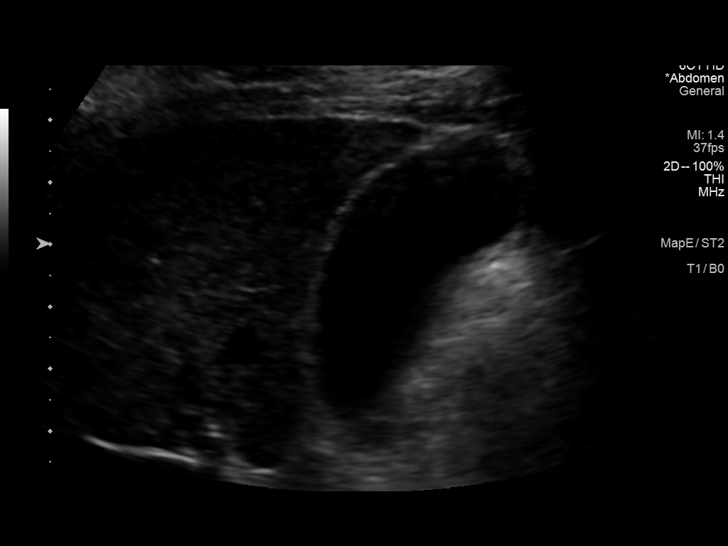
[im 4/42]
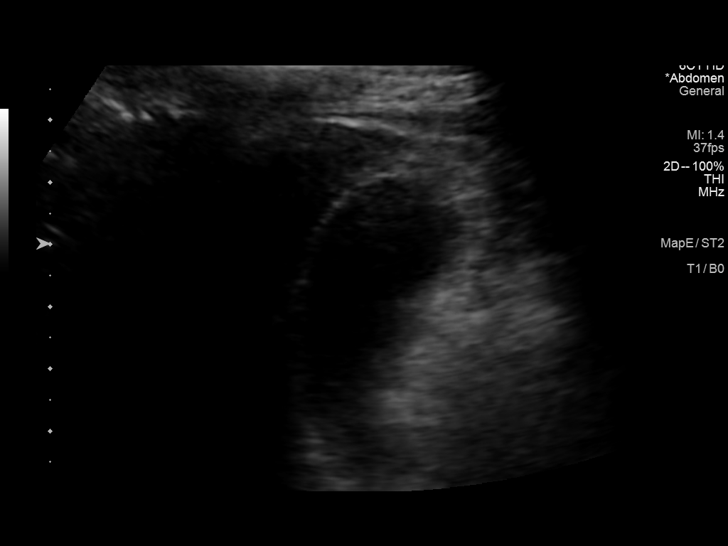
[im 7/42]
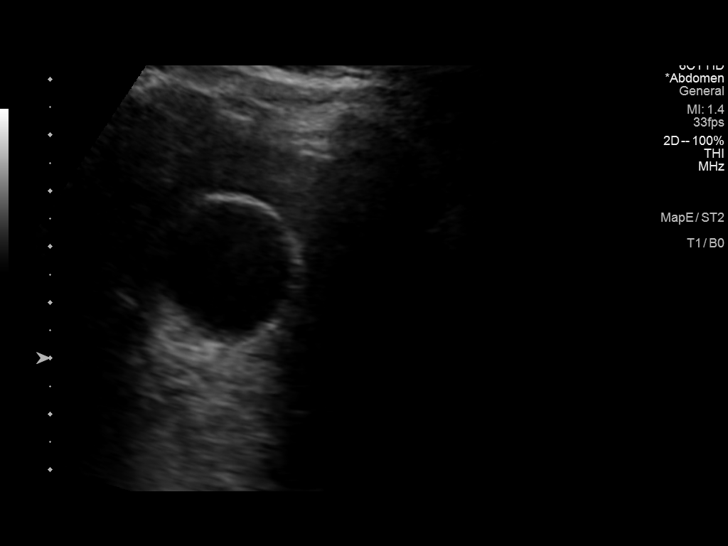
[im 11/42]
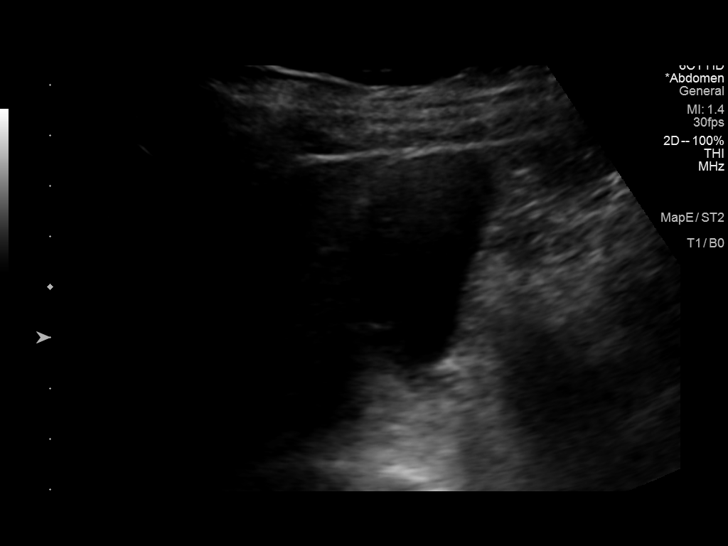
[im 14/42]
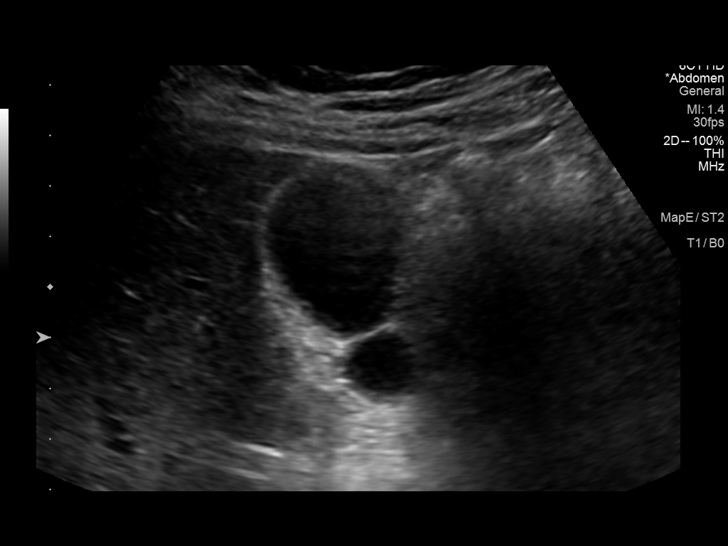
[im 16/42]
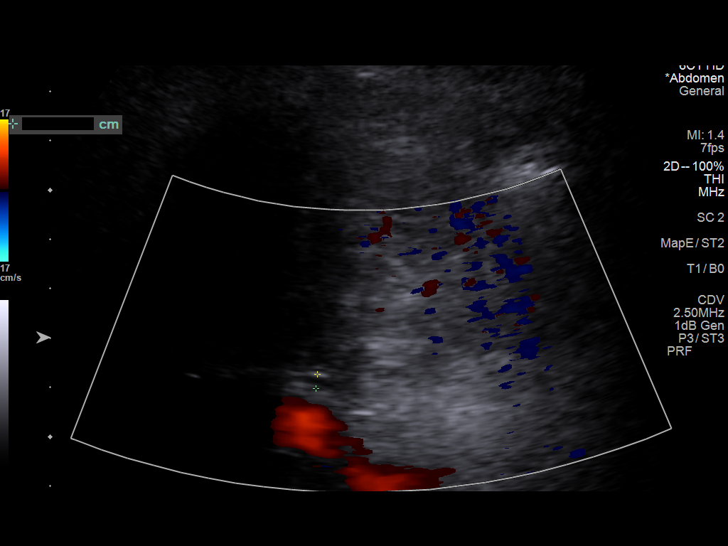
[im 19/42]
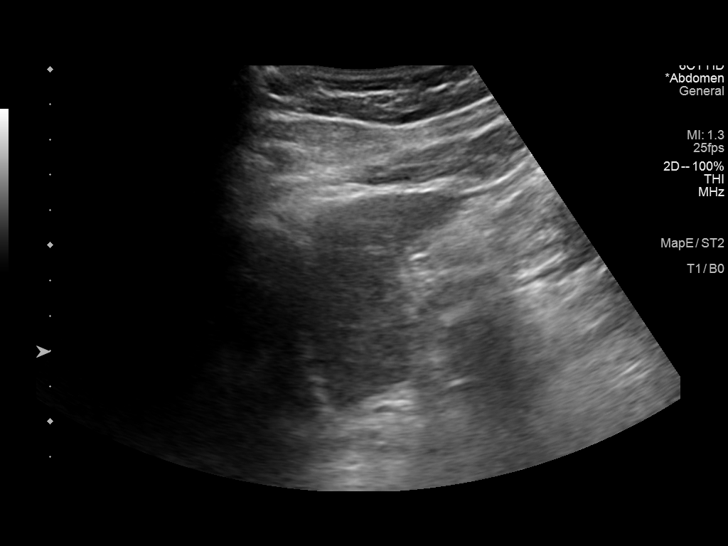
[im 23/42]
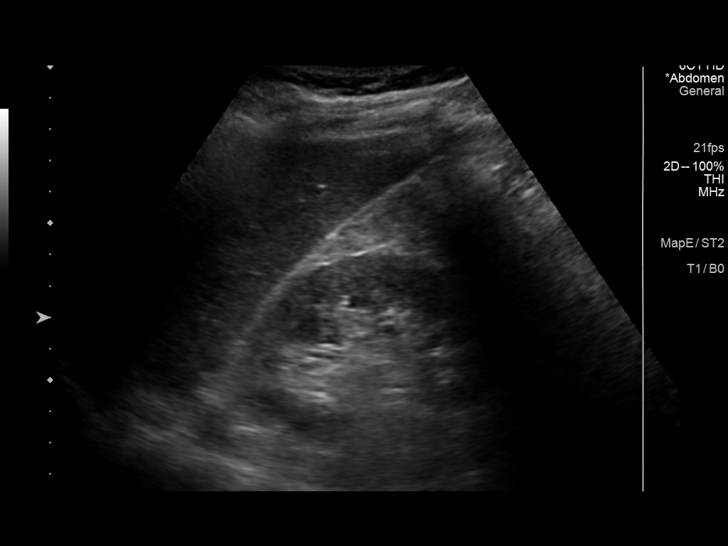
[im 26/42]
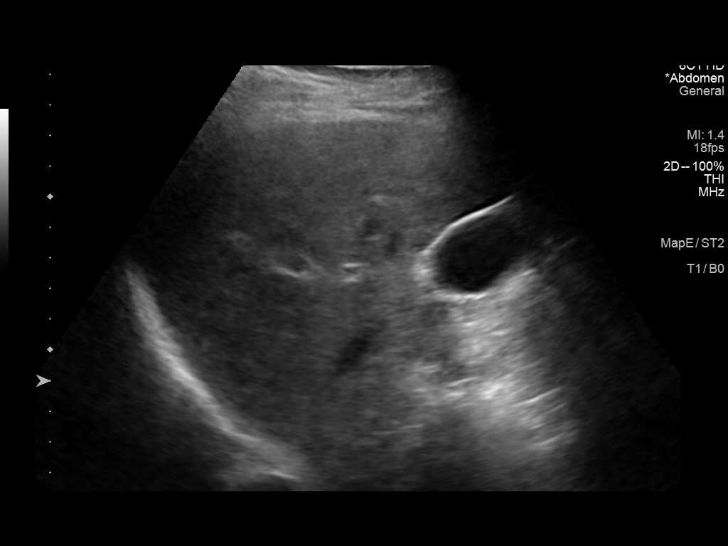
[im 28/42]
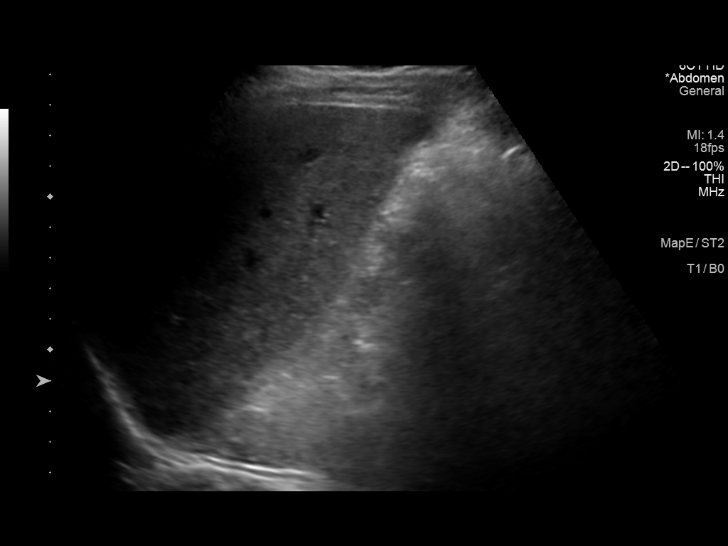
[im 31/42]
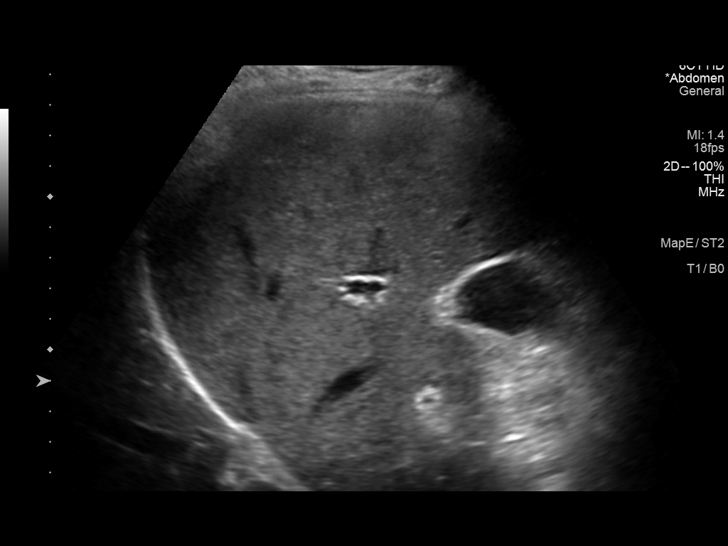
[im 35/42]
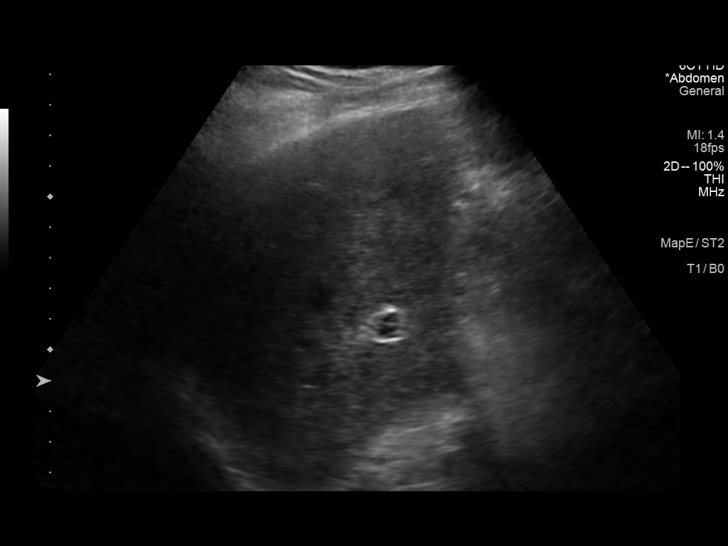
[im 38/42]
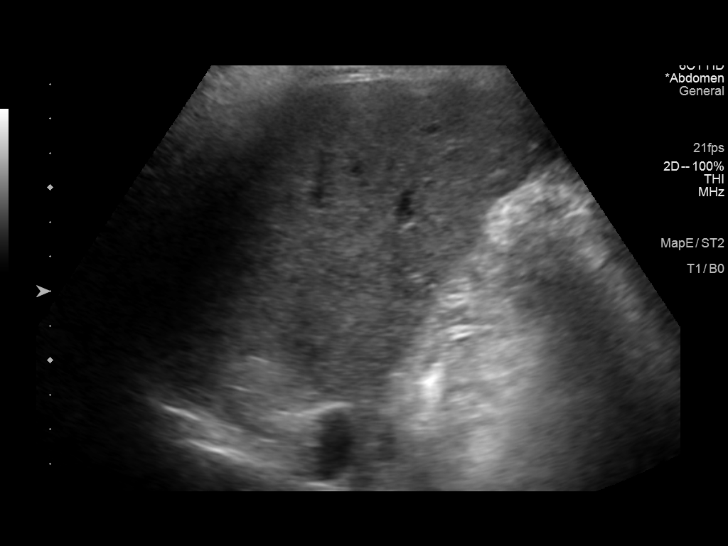
[im 42/42]
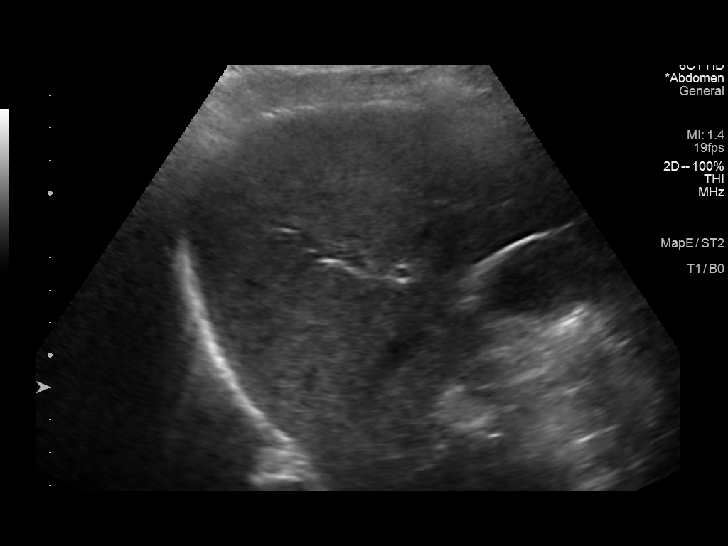

[14 of 25 positions shown; findings below may reference images not displayed]

FINDINGS: Gallbladder:

No gallstones or wall thickening visualized. No sonographic Murphy
sign noted by sonographer.

Common bile duct:

Diameter: 3 mm

Liver:

No focal hepatic lesion. Mildly coarsened parenchymal echogenicity
again seen. Portal vein is patent on color Doppler imaging with
normal direction of blood flow towards the liver.

Other: None.
IMPRESSION: No focal hepatic lesion.

## 2022-11-27 ENCOUNTER — Encounter: Payer: Self-pay | Admitting: Student

## 2022-11-27 ENCOUNTER — Ambulatory Visit (INDEPENDENT_AMBULATORY_CARE_PROVIDER_SITE_OTHER): Payer: Self-pay | Admitting: Student

## 2022-11-27 ENCOUNTER — Ambulatory Visit (HOSPITAL_COMMUNITY)
Admission: RE | Admit: 2022-11-27 | Disposition: A | Discharge status: Home / Self Care | Payer: 59 | Source: Ambulatory Visit

## 2022-11-27 VITALS — BP 117/80 | HR 88 | Ht 64.0 in | Wt 138.6 lb

## 2022-11-27 DIAGNOSIS — B1910 Unspecified viral hepatitis B without hepatic coma: Secondary | ICD-10-CM

## 2022-11-27 DIAGNOSIS — K7460 Unspecified cirrhosis of liver: Secondary | ICD-10-CM

## 2022-11-27 DIAGNOSIS — R079 Chest pain, unspecified: Secondary | ICD-10-CM

## 2022-11-27 DIAGNOSIS — Z87898 Personal history of other specified conditions: Secondary | ICD-10-CM | POA: Insufficient documentation

## 2022-11-27 DIAGNOSIS — Z309 Encounter for contraceptive management, unspecified: Secondary | ICD-10-CM | POA: Insufficient documentation

## 2022-11-27 MED ORDER — OMEPRAZOLE 40 MG PO CPDR: 40.0000 mg | DELAYED_RELEASE_CAPSULE | Freq: Every day | ORAL | 0 refills | Status: DC

## 2022-11-27 NOTE — Assessment & Plan Note (Signed)
-  Urology referral placed for vasectomy

## 2022-11-27 NOTE — Assessment & Plan Note (Signed)
Patient advised to make appointment with ID.  GI referral placed as he is not established with GI currently.

## 2022-11-27 NOTE — Assessment & Plan Note (Signed)
It is reassuring that chest pain worsens when he is lying down at night and does not worsen with exertion.  EKG was ordered and showed normal sinus rhythm.  While I am not convinced chest pain is cardiac in nature, I will refer patient to cardiology.  Chest pain could be related to acid reflux as it occurs when he is lying flat.  Will trial PPI. Anemia as a cause of chest pain is low on the differential as he does not have shortness of breath or fatigue. -CBC -Referral to cardiology -Lipid panel -Omeprazole 40 mg daily -ED precautions given -Return in 2 weeks for follow-up

## 2022-11-27 NOTE — Progress Notes (Signed)
    SUBJECTIVE:   CHIEF COMPLAINT / HPI:   Hepatitis B  cirrhosis Patient follows with ID, last saw them May 2023, labs showed likely resolution of infection. They planned to have him return in 6 months and referred him to GI due to cirrhosis.  Today patient states he missed his last appointment with ID.  Plans to make another appointment soon.  Has not seen GI.  Chest Pain -past 10 days, comes and goes during the day and not bad. Described it as a squeezing/pressure pain. -Worse at night, so bad he can't sleep. Hurts no matter what position he sleeps in, feels the need to take deep breaths -Located around sternum -feels like mucous is stuck in throat at night -No cough, no fever -no dizziness -No headache -no day time fatigue/weakness or SOB with exertion   Desire for contraception Patient would like to have vasectomy.  States he has 2 children in their 42s and does not wish to have anymore children and would like a permanent method of contraception.   PERTINENT  PMH / PSH: HTN, chronic hep B with cirrhosis  OBJECTIVE:   Vitals:   11/27/22 1410  BP: 117/80  Pulse: 88  SpO2: 97%     General: NAD, pleasant, able to participate in exam Cardiac: RRR, no murmurs. Respiratory: CTAB, normal effort, No wheezes, rales or rhonchi Abdomen: Bowel sounds present, nontender, nondistended, soft MSK: No tenderness to palpation of chest wall, no reproducible chest pain with arm motion Skin: warm and dry, no rashes noted Neuro: alert, no obvious focal deficits Psych: Normal affect and mood  ASSESSMENT/PLAN:   Encounter for contraceptive management -Urology referral placed for vasectomy  Chest pain It is reassuring that chest pain worsens when he is lying down at night and does not worsen with exertion.  EKG was ordered and showed normal sinus rhythm.  While I am not convinced chest pain is cardiac in nature, I will refer patient to cardiology.  Chest pain could be related to acid  reflux as it occurs when he is lying flat.  Will trial PPI. Anemia as a cause of chest pain is low on the differential as he does not have shortness of breath or fatigue. 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  GI referral placed as he is not established with GI currently.     Dr. Precious Gilding, [place]

## 2022-11-27 NOTE — Patient Instructions (Addendum)
It was great to see you! Thank you for allowing me to participate in your care!  Our plans for today:  - I have prescribed you a medication for acid reflux called omeprazole.  Please take this once daily. -I have referred you to cardiology for further testing.  They will call you to schedule an appointment -I referred you to urology to have a vasectomy if you desire.  They will call you to schedule an appointment  We are checking some labs today, I will call you if they are abnormal will send you a MyChart message or a letter if they are normal.  If you do not hear about your labs in the next 2 weeks please let us know.  Take care and seek immediate care sooner if you develop any concerns.   Dr. Precious Gilding, DO [facility]

## 2022-11-28 ENCOUNTER — Encounter: Payer: Self-pay | Admitting: Student

## 2022-11-28 LAB — CBC
Hematocrit: 48.7 % (ref 37.5–51.0)
Hemoglobin: 16.8 g/dL (ref 13.0–17.7)
MCH: 29 pg (ref 26.6–33.0)
MCHC: 34.5 g/dL (ref 31.5–35.7)
MCV: 84 fL (ref 79–97)
Platelets: 337 10*3/uL (ref 150–450)
RBC: 5.79 x10E6/uL (ref 4.14–5.80)
RDW: 12.7 % (ref 11.6–15.4)
WBC: 8.6 10*3/uL (ref 3.4–10.8)

## 2022-11-28 LAB — LIPID PANEL
Chol/HDL Ratio: 4.8 ratio (ref 0.0–5.0)
Cholesterol, Total: 189 mg/dL (ref 100–199)
HDL: 39 mg/dL — ABNORMAL LOW (ref >39)
LDL Chol Calc (NIH): 104 mg/dL — ABNORMAL HIGH (ref 0–99)
Triglycerides: 272 mg/dL — ABNORMAL HIGH (ref 0–149)
VLDL Cholesterol Cal: 46 mg/dL — ABNORMAL HIGH (ref 5–40)

## 2022-11-28 LAB — COMPREHENSIVE METABOLIC PANEL
ALT: 16 IU/L (ref 0–44)
AST: 13 IU/L (ref 0–40)
Albumin/Globulin Ratio: 1.5 (ref 1.2–2.2)
Albumin: 4.3 g/dL (ref 4.1–5.1)
Alkaline Phosphatase: 66 IU/L (ref 44–121)
BUN/Creatinine Ratio: 21 — ABNORMAL HIGH (ref 9–20)
BUN: 18 mg/dL (ref 6–24)
Bilirubin Total: 0.2 mg/dL (ref 0.0–1.2)
CO2: 22 mmol/L (ref 20–29)
Calcium: 9.5 mg/dL (ref 8.7–10.2)
Chloride: 101 mmol/L (ref 96–106)
Creatinine, Ser: 0.86 mg/dL (ref 0.76–1.27)
Globulin, Total: 2.8 g/dL (ref 1.5–4.5)
Glucose: 79 mg/dL (ref 70–99)
Potassium: 4.3 mmol/L (ref 3.5–5.2)
Sodium: 141 mmol/L (ref 134–144)
Total Protein: 7.1 g/dL (ref 6.0–8.5)
eGFR: 105 mL/min/{1.73_m2} (ref >59)

## 2022-11-30 ENCOUNTER — Ambulatory Visit: Payer: 59 | Admitting: Internal Medicine

## 2022-12-05 ENCOUNTER — Ambulatory Visit: Payer: 59 | Admitting: Internal Medicine

## 2022-12-05 ENCOUNTER — Encounter: Payer: Self-pay | Admitting: Internal Medicine

## 2022-12-05 ENCOUNTER — Other Ambulatory Visit: Payer: Self-pay

## 2022-12-05 VITALS — BP 133/93 | HR 81 | Temp 98.0°F | Wt 139.0 lb

## 2022-12-05 DIAGNOSIS — B181 Chronic viral hepatitis B without delta-agent: Secondary | ICD-10-CM | POA: Diagnosis not present

## 2022-12-05 DIAGNOSIS — Z9189 Other specified personal risk factors, not elsewhere classified: Secondary | ICD-10-CM | POA: Diagnosis not present

## 2022-12-05 DIAGNOSIS — R69 Illness, unspecified: Secondary | ICD-10-CM | POA: Diagnosis not present

## 2022-12-05 DIAGNOSIS — K7460 Unspecified cirrhosis of liver: Secondary | ICD-10-CM

## 2022-12-05 DIAGNOSIS — B1910 Unspecified viral hepatitis B without hepatic coma: Secondary | ICD-10-CM

## 2022-12-05 NOTE — Assessment & Plan Note (Signed)
Will reschedule for [facility] screning

## 2022-12-05 NOTE — Assessment & Plan Note (Signed)
Known cirrhosis and will re-refer to GI for ? EGD

## 2022-12-05 NOTE — Progress Notes (Signed)
   Subjective:    Patient ID: Brian Preston, male    DOB: 04/11/1971, 52 y.o.   MRN: [id number]  HPI Brian Preston is here for follow up of chronic inactive hepatitis B and cirrhosis. His viral load has remained negative and he has had no issues.  He has not yet been to Gi nor had a recent ultrasound.  No complaints otherwise today.    Review of Systems  Constitutional:  Negative for fatigue.  Gastrointestinal:  Negative for diarrhea and nausea.  Skin:  Negative for rash.       Objective:   Physical Exam Eyes:     General: No scleral icterus. Pulmonary:     Effort: Pulmonary effort is normal.  Neurological:     Mental Status: He is alert.   SH": no alcohol        Assessment & Plan:

## 2022-12-05 NOTE — Assessment & Plan Note (Signed)
He is doing well off of treatment with no indication to start with a suppressed viral load.  Will continue to monitor every 6 months.

## 2022-12-06 LAB — HEPATIC FUNCTION PANEL
AG Ratio: 1.3 (calc) (ref 1.0–2.5)
ALT: 14 U/L (ref 9–46)
AST: 13 U/L (ref 10–35)
Albumin: 4.3 g/dL (ref 3.6–5.1)
Alkaline phosphatase (APISO): 56 U/L (ref 35–144)
Bilirubin, Direct: 0.1 mg/dL (ref 0.0–0.2)
Globulin: 3.2 g/dL (calc) (ref 1.9–3.7)
Indirect Bilirubin: 0.3 mg/dL (calc) (ref 0.2–1.2)
Total Bilirubin: 0.4 mg/dL (ref 0.2–1.2)
Total Protein: 7.5 g/dL (ref 6.1–8.1)

## 2022-12-06 LAB — HEPATITIS B DNA, ULTRAQUANTITATIVE, PCR
Hepatitis B DNA: NOT DETECTED IU/mL
Hepatitis B virus DNA: NOT DETECTED Log IU/mL

## 2022-12-06 LAB — HEPATITIS B SURFACE ANTIGEN: Hepatitis B Surface Ag: NONREACTIVE

## 2022-12-08 NOTE — Progress Notes (Signed)
    SUBJECTIVE:   CHIEF COMPLAINT / HPI:   Chest pain PPI prescribed at last visit, pt states***.   Chronic hep B and cirrhosis Saw ID on 12/05/22, has no viral load. Was referred to GI ***. ID ordered RUQ [place] which is scheduled for 12/22/2022  PERTINENT  PMH / PSH: ***  OBJECTIVE:   There were no vitals taken for this visit. ***  General: NAD, pleasant, able to participate in exam Cardiac: RRR, no murmurs. Respiratory: CTAB, normal effort, No wheezes, rales or rhonchi Abdomen: Bowel sounds present, nontender, nondistended, no hepatosplenomegaly. Extremities: no edema or cyanosis. Skin: warm and dry, no rashes noted Neuro: alert, no obvious focal deficits Psych: Normal affect and mood  ASSESSMENT/PLAN:   No problem-specific Assessment & Plan notes found for this encounter.     Dr. Precious Gilding, [place]    {    This will disappear when note is signed, click to select method of visit    :1}

## 2022-12-11 ENCOUNTER — Other Ambulatory Visit: Payer: Self-pay

## 2022-12-11 ENCOUNTER — Ambulatory Visit (INDEPENDENT_AMBULATORY_CARE_PROVIDER_SITE_OTHER): Payer: 59 | Admitting: Student

## 2022-12-11 VITALS — BP 114/82 | HR 82 | Wt 139.4 lb

## 2022-12-11 DIAGNOSIS — R079 Chest pain, unspecified: Secondary | ICD-10-CM | POA: Diagnosis not present

## 2022-12-11 DIAGNOSIS — R69 Illness, unspecified: Secondary | ICD-10-CM | POA: Diagnosis not present

## 2022-12-11 DIAGNOSIS — B1910 Unspecified viral hepatitis B without hepatic coma: Secondary | ICD-10-CM

## 2022-12-11 DIAGNOSIS — Q181 Preauricular sinus and cyst: Secondary | ICD-10-CM

## 2022-12-11 DIAGNOSIS — K7460 Unspecified cirrhosis of liver: Secondary | ICD-10-CM

## 2022-12-11 MED ORDER — OMEPRAZOLE 40 MG PO CPDR: 40.0000 mg | DELAYED_RELEASE_CAPSULE | Freq: Every day | ORAL | 0 refills | Status: DC

## 2022-12-11 NOTE — Patient Instructions (Addendum)
It was great to see you! Thank you for allowing me to participate in your care!  Our plans for today:  - Call to schedule appointment with gastroenterology: [facility] Address: [address] 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 They will call to schedule an appointment If they ever become infected, please make an appointment at our office.   Take care and seek immediate care sooner if you develop any concerns.   Dr. Precious Gilding, DO [facility]

## 2022-12-12 DIAGNOSIS — Q181 Preauricular sinus and cyst: Secondary | ICD-10-CM | POA: Insufficient documentation

## 2022-12-12 NOTE — Assessment & Plan Note (Signed)
Patient provided with contact information for GI referral which was placed at last visit.

## 2022-12-12 NOTE — Assessment & Plan Note (Signed)
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

## 2022-12-12 NOTE — Assessment & Plan Note (Signed)
Greatly improved with Protonix.  Could be due to acid reflux. -Continue with Protonix 40 mg daily

## 2022-12-22 ENCOUNTER — Ambulatory Visit
Admission: RE | Admit: 2022-12-22 | Discharge: 2022-12-22 | Disposition: A | Discharge status: Home / Self Care | Payer: 59 | Source: Ambulatory Visit | Attending: Internal Medicine | Admitting: Internal Medicine

## 2022-12-22 DIAGNOSIS — B1910 Unspecified viral hepatitis B without hepatic coma: Secondary | ICD-10-CM

## 2022-12-22 DIAGNOSIS — R69 Illness, unspecified: Secondary | ICD-10-CM | POA: Diagnosis not present

## 2022-12-25 ENCOUNTER — Other Ambulatory Visit: Payer: Self-pay | Admitting: Student

## 2022-12-25 DIAGNOSIS — R079 Chest pain, unspecified: Secondary | ICD-10-CM

## 2022-12-26 ENCOUNTER — Encounter: Payer: Self-pay | Admitting: Cardiology

## 2022-12-26 ENCOUNTER — Ambulatory Visit: Payer: 59 | Attending: Cardiology | Admitting: Cardiology

## 2022-12-26 VITALS — BP 118/78 | HR 87 | Ht 64.0 in | Wt 136.8 lb

## 2022-12-26 DIAGNOSIS — R072 Precordial pain: Secondary | ICD-10-CM

## 2022-12-26 DIAGNOSIS — R0602 Shortness of breath: Secondary | ICD-10-CM | POA: Diagnosis not present

## 2022-12-26 MED ORDER — METOPROLOL TARTRATE 100 MG PO TABS: 100.0000 mg | ORAL_TABLET | ORAL | 0 refills | Status: DC

## 2022-12-26 NOTE — Patient Instructions (Signed)
Medication Instructions:  The current medical regimen is effective;  continue present plan and medications.  *If you need a refill on your cardiac medications before your next appointment, please call your pharmacy*  Testing/Procedures: Your physician has requested that you have an echocardiogram. Echocardiography is a painless test that uses sound waves to create images of your heart. It provides your doctor with information about the size and shape of your heart and how well your heart's chambers and valves are working. This procedure takes approximately one hour. There are no restrictions for this procedure. Please do NOT wear cologne, perfume, aftershave, or lotions (deodorant is allowed). Please arrive 15 minutes prior to your appointment time.    Your cardiac CT will be scheduled at:   [facility] [address] [place], [place] [address] [phone]   Please arrive at the [facility] and Children's Entrance (Entrance C2) of [facility] 30 minutes prior to test start time. You can use the FREE valet parking offered at entrance C (encouraged to control the heart rate for the test)  Proceed to the [facility] Radiology Department (first floor) to check-in and test prep.  All radiology patients and guests should use entrance C2 at [facility], accessed from [facility], even though the hospital's physical address listed is [address]     Please follow these instructions carefully (unless otherwise directed):  Hold all erectile dysfunction medications at least 3 days (72 hrs) prior to test. (Ie viagra, cialis, sildenafil, tadalafil, etc) We will administer nitroglycerin during this exam.   On the Night Before the Test: Be sure to Drink plenty of water. Do not consume any caffeinated/decaffeinated beverages or chocolate 12 hours prior to your test. Do not take any antihistamines 12 hours prior to your test.  On the Day of the  Test: Drink plenty of water until 1 hour prior to the test. Do not eat any food 1 hour prior to test. You may take your regular medications prior to the test.  Take metoprolol (Lopressor) two hours prior to test. If you take Furosemide/Hydrochlorothiazide/Spironolactone, please HOLD on the morning of the test.  After the Test: Drink plenty of water. After receiving IV contrast, you may experience a mild flushed feeling. This is normal. On occasion, you may experience a mild rash up to 24 hours after the test. This is not dangerous. If this occurs, you can take Benadryl 25 mg and increase your fluid intake. If you experience trouble breathing, this can be serious. If it is severe call 911 IMMEDIATELY. If it is mild, please call our office.  We will call to schedule your test 2-4 weeks out understanding that some insurance companies will need an authorization prior to the service being performed.   For non-scheduling related questions, please contact the cardiac imaging nurse navigator should you have any questions/concerns: Marchia Bond, Cardiac Imaging Nurse Navigator Gordy Clement, Cardiac Imaging Nurse Navigator [facility] Direct Office Dial: [phone]   For scheduling needs, including cancellations and rescheduling, please call [place], [phone].   Follow-Up: At [facility], you and your health needs are our priority.  As part of our continuing mission to provide you with exceptional heart care, we have created designated Provider Care Teams.  These Care Teams include your primary Cardiologist (physician) and Advanced Practice Providers (APPs -  Physician Assistants and Nurse Practitioners) who all work together to provide you with the care you need, when you need  it.  We recommend signing up for the patient portal called "MyChart".  Sign up information is provided on this After Visit Summary.  MyChart is used to connect with patients for  Virtual Visits (Telemedicine).  Patients are able to view lab/test results, encounter notes, upcoming appointments, etc.  Non-urgent messages can be sent to your provider as well.   To learn more about what you can do with MyChart, go to [email or web address].    Your next appointment:   Follow up as needed after the above testing.

## 2022-12-26 NOTE — Progress Notes (Signed)
Cardiology Office Note:    Date:  12/26/2022   ID:  Brian Preston, DOB [birth year], MRN [id number]  PCP:  Brian Preston, [place] Providers Cardiologist:  None     Referring MD: McDiarmid, Blane Ohara, MD    History of Present Illness:    Brian Preston is a 52 y.o. male here for the evaluation of chest pain at the request of Brian Preston.  Chest pain. At night wake up with gasps. Now chest tightness. Non exertional for the most part.  Mild shortness of breath with activity.  Moderate intensity discomfort.  This worries him.  Works at [employer] Non smoker Mother had CABG, CVA He has 2 children.    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  Has upcoming appointment referrals in place.  He has had colonoscopy in 2021 at age 53 with Dr. Billie Preston of GI.  This was normal.    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 No pertinent surgical history.  Current Medications: Current Meds  Medication Sig   lisinopril-hydrochlorothiazide (ZESTORETIC) 10-12.5 MG tablet Take 1 tablet by mouth daily.   metoprolol tartrate (LOPRESSOR) 100 MG tablet Take 1 tablet (100 mg total) by mouth as directed. Take one tablet (2) hours before your CT scan   omeprazole (PRILOSEC) 40 MG capsule TAKE 1 CAPSULE BY MOUTH EVERY DAY     Allergies:   Patient has no known allergies.   Social History   Socioeconomic History   Marital status: Married    Spouse name: Brian Preston   Number of children: 2   Years of education: Not on file   Highest education level: Not on file  Occupational History   Not on file  Tobacco Use   Smoking status: Former    Types: Cigarettes    Quit date: 11/22/1992    Years since quitting: 30.1   Smokeless tobacco: Former    Quit date: 1992   Tobacco comments:    Quit about 30 years ago, was smoking about 1-2 pack per day for  10 years  Vaping Use   Vaping Use: Never used  Substance and Sexual Activity   Alcohol use: Yes    Alcohol/week: 1.0 standard drink of alcohol    Types: 1 Cans of beer per week    Comment: occ   Drug use: No   Sexual activity: Yes    Partners: Female  Other Topics Concern   Not on file  Social History Narrative   From [place] to [place] 1994   Lives with wife   Children - 2 - son and daughter   Social Determinants of Health   Financial Resource Strain: Not on file  Food Insecurity: Not on file  Transportation Needs: Not on file  Physical Activity: Not on file  Stress: Not on file  Social Connections: Not on file     Family History: The patient's family history includes Hypertension in his father and mother; Stroke (age of onset: 70) in his mother. There is no history of Colon cancer, Stomach cancer, Esophageal cancer, or Pancreatic cancer.  ROS:   Please see the history of present illness.     All other systems reviewed and are negative.  EKGs/Labs/Other Studies Reviewed:    The following  studies were reviewed today: Prior office note lab work EKG reviewed  EKG: 11/27/2022: Sinus rhythm 92 no other abnormalities  Recent Labs: 11/27/2022: BUN 18; Creatinine, Ser 0.86; Hemoglobin 16.8; Platelets 337; Potassium 4.3; Sodium 141 12/05/2022: ALT 14  Recent Lipid Panel    Component Value Date/Time   CHOL 189 11/27/2022 1531   TRIG 272 (H) 11/27/2022 1531   HDL 39 (L) 11/27/2022 1531   CHOLHDL 4.8 11/27/2022 1531   CHOLHDL 4.7 11/25/2015 1431   VLDL 28 11/25/2015 1431   LDLCALC 104 (H) 11/27/2022 1531     Risk Assessment/Calculations:                Physical Exam:    VS:  BP 118/78   Pulse 87   Ht 5' 4"$  (1.626 m)   Wt 136 lb 12.8 oz (62.1 kg)   SpO2 98%   BMI 23.48 kg/m     Wt Readings from Last 3 Encounters:  12/26/22 136 lb 12.8 oz (62.1 kg)  12/11/22 139 lb 6.4 oz (63.2 kg)  12/05/22 139 lb (63 kg)     GEN:  Well nourished, well developed in no acute  distress HEENT: Normal NECK: No JVD; No carotid bruits LYMPHATICS: No lymphadenopathy CARDIAC: RRR, no murmurs, rubs, gallops RESPIRATORY:  Clear to auscultation without rales, wheezing or rhonchi  ABDOMEN: Soft, non-tender, non-distended MUSCULOSKELETAL:  No edema; No deformity  SKIN: Warm and dry NEUROLOGIC:  Alert and oriented x 3 PSYCHIATRIC:  Normal affect   ASSESSMENT:    1. Precordial pain   2. Shortness of breath    PLAN:    In order of problems listed above:  Precordial chest pain/chest tightness - Mother had bypass surgery.  Former smoker several years ago.  Chest tightness.  We will go ahead and proceed with coronary CT scan.  He will need metoprolol 100 mg.  Recent creatinine was normal. -We will follow-up with results of study.  May need further medication management etc. -ED precautions.  Shortness of breath - Occasionally will wake up gasping for breath.  Has noted some increased shortness of breath with activity.  We will check an echocardiogram to ensure proper structure and function of his heart.  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  Liver enzymes are normal.  Recent LDL 104 TSH 2.3 creatinine 0.8 from outside labs.           Medication Adjustments/Labs and Tests Ordered: Current medicines are reviewed at length with the patient today.  Concerns regarding medicines are outlined above.  Orders Placed This Encounter  Procedures   CT CORONARY MORPH W/CTA COR W/SCORE W/CA W/CM &/OR WO/CM   ECHOCARDIOGRAM COMPLETE   Meds ordered this encounter  Medications   metoprolol tartrate (LOPRESSOR) 100 MG tablet    Sig: Take 1 tablet (100 mg total) by mouth as directed. Take one tablet (2) hours before your CT scan    Dispense:  1 tablet    Refill:  0    Patient Instructions  Medication Instructions:  The current medical regimen  is effective;  continue present plan and medications.  *If you need a refill on your cardiac medications before your next appointment, please call your pharmacy*  Testing/Procedures: Your physician has requested that you have an echocardiogram. Echocardiography is a painless test that uses sound waves to create images of your heart. It provides your doctor with  information about the size and shape of your heart and how well your heart's chambers and valves are working. This procedure takes approximately one hour. There are no restrictions for this procedure. Please do NOT wear cologne, perfume, aftershave, or lotions (deodorant is allowed). Please arrive 15 minutes prior to your appointment time.    Your cardiac CT will be scheduled at:   [facility] [address] [place], [place] [address] [phone]   Please arrive at the [facility] and Children's Entrance (Entrance C2) of [facility] 30 minutes prior to test start time. You can use the FREE valet parking offered at entrance C (encouraged to control the heart rate for the test)  Proceed to the [facility] Radiology Department (first floor) to check-in and test prep.  All radiology patients and guests should use entrance C2 at [facility], accessed from [facility], even though the hospital's physical address listed is [address].     Please follow these instructions carefully (unless otherwise directed):  Hold all erectile dysfunction medications at least 3 days (72 hrs) prior to test. (Ie viagra, cialis, sildenafil, tadalafil, etc) We will administer nitroglycerin during this exam.   On the Night Before the Test: Be sure to Drink plenty of water. Do not consume any caffeinated/decaffeinated beverages or chocolate 12 hours prior to your test. Do not take any antihistamines 12 hours prior to your test.  On the Day of the Test: Drink plenty of water until 1 hour prior to the  test. Do not eat any food 1 hour prior to test. You may take your regular medications prior to the test.  Take metoprolol (Lopressor) two hours prior to test. If you take Furosemide/Hydrochlorothiazide/Spironolactone, please HOLD on the morning of the test.  After the Test: Drink plenty of water. After receiving IV contrast, you may experience a mild flushed feeling. This is normal. On occasion, you may experience a mild rash up to 24 hours after the test. This is not dangerous. If this occurs, you can take Benadryl 25 mg and increase your fluid intake. If you experience trouble breathing, this can be serious. If it is severe call 911 IMMEDIATELY. If it is mild, please call our office.  We will call to schedule your test 2-4 weeks out understanding that some insurance companies will need an authorization prior to the service being performed.   For non-scheduling related questions, please contact the cardiac imaging nurse navigator should you have any questions/concerns: Marchia Bond, Cardiac Imaging Nurse Navigator Gordy Clement, Cardiac Imaging Nurse Navigator [facility] Direct Office Dial: [phone]   For scheduling needs, including cancellations and rescheduling, please call [place], [phone].   Follow-Up: At [facility], you and your health needs are our priority.  As part of our continuing mission to provide you with exceptional heart care, we have created designated Provider Care Teams.  These Care Teams include your primary Cardiologist (physician) and Advanced Practice Providers (APPs -  Physician Assistants and Nurse Practitioners) who all work together to provide you with the care you need, when you need it.  We recommend signing up for the patient portal called "MyChart".  Sign up information is provided on this After Visit Summary.  MyChart is used to connect with patients for Virtual Visits (Telemedicine).  Patients are able to  view lab/test results, encounter notes, upcoming appointments, etc.  Non-urgent messages can be sent to your provider as well.   To learn  more about what you can do with MyChart, go to [email or web address].    Your next appointment:   Follow up as needed after the above testing.    Signed, Candee Furbish, MD  12/26/2022 10:53 AM    [place]

## 2023-01-04 DIAGNOSIS — Z3009 Encounter for other general counseling and advice on contraception: Secondary | ICD-10-CM | POA: Diagnosis not present

## 2023-01-22 ENCOUNTER — Ambulatory Visit (HOSPITAL_COMMUNITY): Payer: 59 | Attending: Cardiovascular Disease

## 2023-01-22 ENCOUNTER — Other Ambulatory Visit: Payer: Self-pay | Admitting: Student

## 2023-01-22 DIAGNOSIS — R0602 Shortness of breath: Secondary | ICD-10-CM | POA: Insufficient documentation

## 2023-01-22 DIAGNOSIS — R072 Precordial pain: Secondary | ICD-10-CM | POA: Diagnosis not present

## 2023-01-22 DIAGNOSIS — R079 Chest pain, unspecified: Secondary | ICD-10-CM

## 2023-01-22 DIAGNOSIS — I5030 Unspecified diastolic (congestive) heart failure: Secondary | ICD-10-CM | POA: Diagnosis not present

## 2023-01-22 LAB — ECHOCARDIOGRAM COMPLETE
Area-P 1/2: 3.39 cm2
S' Lateral: 2.6 cm

## 2023-01-24 ENCOUNTER — Encounter: Payer: Self-pay | Admitting: Student

## 2023-01-24 ENCOUNTER — Encounter: Payer: Self-pay | Admitting: Physician Assistant

## 2023-01-24 ENCOUNTER — Ambulatory Visit: Payer: 59 | Admitting: Physician Assistant

## 2023-01-24 VITALS — BP 110/72 | HR 69 | Ht 64.0 in | Wt 137.0 lb

## 2023-01-24 DIAGNOSIS — R1310 Dysphagia, unspecified: Secondary | ICD-10-CM | POA: Diagnosis not present

## 2023-01-24 DIAGNOSIS — R1013 Epigastric pain: Secondary | ICD-10-CM | POA: Diagnosis not present

## 2023-01-24 DIAGNOSIS — K219 Gastro-esophageal reflux disease without esophagitis: Secondary | ICD-10-CM

## 2023-01-24 MED ORDER — PANTOPRAZOLE SODIUM 40 MG PO TBEC: 40.0000 mg | DELAYED_RELEASE_TABLET | Freq: Two times a day (BID) | ORAL | 3 refills | Status: DC

## 2023-01-24 NOTE — Patient Instructions (Addendum)
You have been scheduled for an endoscopy. Please follow written instructions given to you at your visit today. If you use inhalers (even only as needed), please bring them with you on the day of your procedure.  Stop Omeprazole.  Due to recent changes in healthcare laws, you may see the results of your imaging and laboratory studies on MyChart before your provider has had a chance to review them.  We understand that in some cases there may be results that are confusing or concerning to you. Not all laboratory results come back in the same time frame and the provider may be waiting for multiple results in order to interpret others.  Please give [place] 48 hours in order for your provider to thoroughly review all the results before contacting the office for clarification of your results.   It was a pleasure to see you today!  Thank you for trusting me with your gastrointestinal care!

## 2023-01-24 NOTE — Progress Notes (Signed)
Chief Complaint: GERD and abdominal pain  HPI:    Mr. Brian Preston is a 52 year old [place] male, known to Dr. Hilarie Fredrickson, with a past medical history as listed below including cirrhosis due to hepatitis B, who was referred to me by Precious Gilding, DO for a complaint of GERD and abdominal pain..      08/03/2020 patient seen in clinic for rectal bleeding.  At that time noted a past medical history significant for chronic hepatitis B and hypertension.  Described intermittent bleeding for a couple of years.  At that time normal hemoglobin.  Recommended a colonoscopy.  Noted his chronic hepatitis B was followed by infectious disease.    08/18/2020 colonoscopy with a few small mouth diverticula in the sigmoid colon, internal hemorrhoids and otherwise normal.  Hemorrhoids thought to be the source of intermittent bleeding.  Repeat recommended 10 years for screening.    11/27/2022 CBC and CMP normal.    12/05/2022 patient followed with infectious disease for hepatitis B.  At that time noted his viral load remained negative and he had no problems.  He was scheduled for [facility] screening with ultrasound.    12/22/2022 right upper quadrant ultrasound was normal.    12/26/2022 office visit with cardiology and discussed chest pain.  Described waking up at night with gasps and chest tightness.  Nonexertional.  At that time they ordered a coronary CT scan.    Today, patient presents to clinic with an interpreter who he uses minimally, and tells me that he has had issues with his stomach for the past ten years.  Apparently he has had 2 prior endoscopies that he remembers at the hospital with Dr. Collene Mares and [place].  The last was about 10 years ago.  Explains that after his endoscopies they would put him on medicine and his symptoms will get some better and then they would come back.  Now over the past year symptoms have come back and have not gotten any better on Omeprazole 40 mg daily which he was started on over a year ago.  Describes  reflux symptoms and regurgitation as well as a feeling of things getting stuck in his throat and constant bad breath with some epigastric pain, worse after eating.  Also hiccups.    Does tell me that he already had his CT angiography and it was "normal", I cannot see the result in the computer yet.    Denies fever, chills, weight loss, blood in the stool, nausea, vomiting or symptoms that awaken him from sleep.  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 No pertinent surgical history.  Current Outpatient Medications  Medication Sig Dispense Refill   lisinopril-hydrochlorothiazide (ZESTORETIC) 10-12.5 MG tablet Take 1 tablet by mouth daily. 90 tablet 3   metoprolol tartrate (LOPRESSOR) 100 MG tablet Take 1 tablet (100 mg total) by mouth as directed. Take one tablet (2) hours before your CT scan 1 tablet 0   omeprazole (PRILOSEC) 40 MG capsule Take 1 capsule by mouth once daily 30 capsule 0   No current facility-administered medications for this visit.    Allergies as of 01/24/2023   (No Known Allergies)    Family History  Problem Relation Age of Onset   Hypertension Mother    Stroke Mother 74   Hypertension Father    Colon cancer Neg  Hx    Stomach cancer Neg Hx    Esophageal cancer Neg Hx    Pancreatic cancer Neg Hx     Social History   Socioeconomic History   Marital status: Married    Spouse name: [name]   Number of children: 2   Years of education: Not on file   Highest education level: Not on file  Occupational History   Not on file  Tobacco Use   Smoking status: Former    Types: Cigarettes    Quit date: 11/22/1992    Years since quitting: 30.1   Smokeless tobacco: Former    Quit date: 1992   Tobacco comments:    Quit about 30 years ago, was smoking about 1-2 pack per day for 10 years  Vaping Use   Vaping Use: Never used  Substance and Sexual  Activity   Alcohol use: Yes    Alcohol/week: 1.0 standard drink of alcohol    Types: 1 Cans of beer per week    Comment: occ   Drug use: No   Sexual activity: Yes    Partners: Female  Other Topics Concern   Not on file  Social History Narrative   From [place] to [place] 1994   Lives with wife   Children - 2 - son and daughter   Social Determinants of Health   Financial Resource Strain: Not on file  Food Insecurity: Not on file  Transportation Needs: Not on file  Physical Activity: Not on file  Stress: Not on file  Social Connections: Not on file  Intimate Partner Violence: Not on file    Review of Systems:    Constitutional: No weight loss, fever or chills Skin: No rash  Cardiovascular: No chest pain   Respiratory: No SOB  Gastrointestinal: See HPI and otherwise negative Genitourinary: No dysuria Neurological: No headache, dizziness or syncope Musculoskeletal: No new muscle or joint pain Hematologic: No bleeding  Psychiatric: No history of depression or anxiety   Physical Exam:  Vital signs: BP 110/72   Pulse 69   Ht '5\' 4"'$  (1.626 m)   Wt 137 lb (62.1 kg)   BMI 23.52 kg/m    Constitutional:   Pleasant [place] male appears to be in NAD, Well developed, Well nourished, alert and cooperative Head:  Normocephalic and atraumatic. Eyes:   PEERL, EOMI. No icterus. Conjunctiva pink. Ears:  Normal auditory acuity. Neck:  Supple Throat: Oral cavity and pharynx without inflammation, swelling or lesion.  Respiratory: Respirations even and unlabored. Lungs clear to auscultation bilaterally.   No wheezes, crackles, or rhonchi.  Cardiovascular: Normal S1, S2. No MRG. Regular rate and rhythm. No peripheral edema, cyanosis or pallor.  Gastrointestinal:  Soft, nondistended,moderate epigastric ttp, No rebound or guarding. Normal bowel sounds. No appreciable masses or hepatomegaly. Rectal:  Not performed.  Msk:  Symmetrical without gross deformities. Without edema, no deformity or  joint abnormality.  Neurologic:  Alert and  oriented x4;  grossly normal neurologically.  Skin:   Dry and intact without significant lesions or rashes. Psychiatric: Demonstrates good judgement and reason without abnormal affect or behaviors.  RELEVANT LABS AND IMAGING: CBC    Component Value Date/Time   WBC 8.6 11/27/2022 1531   WBC 11.9 (H) 04/03/2017 1553   RBC 5.79 11/27/2022 1531   RBC 5.29 04/03/2017 1553   HGB 16.8 11/27/2022 1531   HCT 48.7 11/27/2022 1531   PLT 337 11/27/2022 1531   MCV 84 11/27/2022 1531   MCH 29.0 11/27/2022 1531  MCH 28.7 04/03/2017 1553   MCHC 34.5 11/27/2022 1531   MCHC 34.4 04/03/2017 1553   RDW 12.7 11/27/2022 1531   LYMPHSABS 2.0 01/04/2018 0951   MONOABS 0.8 04/03/2017 1553   EOSABS 0.6 (H) 01/04/2018 0951   BASOSABS 0.0 01/04/2018 0951    CMP     Component Value Date/Time   NA 141 11/27/2022 1531   K 4.3 11/27/2022 1531   CL 101 11/27/2022 1531   CO2 22 11/27/2022 1531   GLUCOSE 79 11/27/2022 1531   GLUCOSE 96 03/31/2020 1040   BUN 18 11/27/2022 1531   CREATININE 0.86 11/27/2022 1531   CREATININE 0.95 03/31/2020 1040   CALCIUM 9.5 11/27/2022 1531   PROT 7.5 12/05/2022 0851   PROT 7.1 11/27/2022 1531   ALBUMIN 4.3 11/27/2022 1531   AST 13 12/05/2022 0851   ALT 14 12/05/2022 0851   ALKPHOS 66 11/27/2022 1531   BILITOT 0.4 12/05/2022 0851   BILITOT 0.2 11/27/2022 1531   GFRNONAA 94 05/25/2020 0905   GFRNONAA 94 03/31/2020 1040   GFRAA 109 05/25/2020 0905   GFRAA 109 03/31/2020 1040    Assessment: 1.  Epigastric pain/GERD/hiccups/bad breath: Previous EGDs x 2 per the patient, unsure of findings, the last 10 years ago, continued epigastric pain and symptoms over the past year regardless of Meprazole 40 mg daily; consider H. pylori versus gastritis versus PUD versus other  Plan: 1.  Patient really wanted an EGD.  Will repeat one now with Dr. Hilarie Fredrickson in the [facility] for further evaluation of ongoing epigastric pain and reflux symptoms  regardless of Omeprazole 40 mg daily for the past year.  Did provide the patient a detailed list of risks and benefits for the procedure and he agrees to proceed. Patient is appropriate for endoscopic procedure(s) in the ambulatory ([place]) setting.  2.  We will change patient from Omeprazole to Pantoprazole 40 mg twice daily, 30-60 minutes before breakfast and dinner #60 with 5 refills 3.  We will try to get records from Dr. Collene Mares [place] in regards to previous EGDs, the last 10 years ago. 4.  Reviewed antireflux diet and lifestyle modifications. 5.  Patient to follow in clinic per recommendations from Dr. Hilarie Fredrickson after time of procedure.  Ellouise Newer, PA-C [facility] 01/24/2023, 8:57 AM  Cc: Precious Gilding, DO

## 2023-02-05 ENCOUNTER — Ambulatory Visit (INDEPENDENT_AMBULATORY_CARE_PROVIDER_SITE_OTHER): Payer: 59 | Admitting: Student

## 2023-02-05 ENCOUNTER — Encounter: Payer: Self-pay | Admitting: Student

## 2023-02-05 VITALS — BP 121/89 | HR 74 | Ht 64.0 in | Wt 139.0 lb

## 2023-02-05 DIAGNOSIS — B181 Chronic viral hepatitis B without delta-agent: Secondary | ICD-10-CM | POA: Diagnosis not present

## 2023-02-05 DIAGNOSIS — K219 Gastro-esophageal reflux disease without esophagitis: Secondary | ICD-10-CM | POA: Insufficient documentation

## 2023-02-05 DIAGNOSIS — Z0000 Encounter for general adult medical examination without abnormal findings: Secondary | ICD-10-CM

## 2023-02-05 DIAGNOSIS — Z23 Encounter for immunization: Secondary | ICD-10-CM

## 2023-02-05 DIAGNOSIS — Z87898 Personal history of other specified conditions: Secondary | ICD-10-CM

## 2023-02-05 MED ORDER — ZOSTER VAC RECOMB ADJUVANTED 50 MCG/0.5ML IM SUSR: 0.5000 mL | Freq: Once | INTRAMUSCULAR | 0 refills | Status: AC

## 2023-02-05 NOTE — Patient Instructions (Addendum)
It was great to see you! Thank you for allowing me to participate in your care!  I recommend that you always bring your medications to each appointment as this makes it easy to ensure you are on the correct medications and helps [place] not miss when refills are needed.  Our plans for today:  - To find out about scheduling the cardiac CT, call [place] at [phone].  - Call Dr. Marlou Porch to get another prescription of metoprolol before the cardiac CT scan:  [phone] -Go to your appointments listed below  - Take the shingrix prescription to the pharmacy to get your vaccine to prevent shingles  - return in 4 months for follow up   Take care and seek immediate care sooner if you develop any concerns.   Dr. Precious Gilding, DO [facility]

## 2023-02-05 NOTE — Progress Notes (Signed)
    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 Recently had RUQ [place] which was normal.   Has EGD planned with GI due to GERD and epigastric pain which is currently well-controlled on pantoprazole.  Currently following with cardiology d/t hx of CP.  Cardiologist ordered a cardiac CT and prescribed  metoprolol to take prior to the CT.  He misunderstood and took it prior to echocardiogram instead (normal echo findings).  Cardiac CT has not yet been scheduled, he is unsure when this is going to happen or if he should call anyone.  He has no chest pain today.    Tobacco: Pt Quit smoking at age 29.  Alcohol: Drinks beer on occasion, 1 beer/week   PERTINENT  PMH / PSH:   OBJECTIVE:   BP 121/89   Pulse 74   Ht 5\' 4"  (1.626 m)   Wt 139 lb (63 kg)   SpO2 99%   BMI 23.86 kg/m    General: NAD, pleasant, able to participate in exam Cardiac: RRR, normal S1/S2 Respiratory: CTAB, normal effort Skin: warm and dry, no rashes noted MSK: 5/5 muscle strength of BLEs and BUEs Abdomen: Soft, nontender palpation, nondistended Neuro: alert, no obvious focal deficits Psych: Normal affect and mood  ASSESSMENT/PLAN:   History of chest pain No chest pain currently.  Is following with cardiology.  Patient was given phone number to call to find out information about scheduling cardiac CT which has been ordered by cardiologist.  Chronic hepatitis B ([place]) Recent RUQ [place] normal.  Patient advised to continue following with ID and GI  Gastroesophageal reflux disease Patient to continue pantoprazole and following with GI with plan for upcoming endoscopy.   Health maintenance Patient given prescription for shingles vaccine Lipid panel done January 2024, statin medication not indicated at this time Up-to-date on colonoscopy Hep C and HIV screening  completed in 2018  Patient to return in about 4 months to ensure all of his tests have been completed and recap findings   Dr. Precious Gilding, [place]

## 2023-02-05 NOTE — Assessment & Plan Note (Signed)
No chest pain currently.  Is following with cardiology.  Patient was given phone number to call to find out information about scheduling cardiac CT which has been ordered by cardiologist.

## 2023-02-05 NOTE — Assessment & Plan Note (Signed)
Recent RUQ [place] normal.  Patient advised to continue following with ID and GI

## 2023-02-05 NOTE — Assessment & Plan Note (Signed)
Patient to continue pantoprazole and following with GI with plan for upcoming endoscopy.

## 2023-02-07 ENCOUNTER — Other Ambulatory Visit (HOSPITAL_COMMUNITY): Payer: Self-pay | Admitting: *Deleted

## 2023-02-07 ENCOUNTER — Telehealth (HOSPITAL_COMMUNITY): Payer: Self-pay | Admitting: *Deleted

## 2023-02-07 ENCOUNTER — Encounter: Payer: Self-pay | Admitting: Cardiology

## 2023-02-07 ENCOUNTER — Encounter (HOSPITAL_COMMUNITY): Payer: Self-pay

## 2023-02-07 MED ORDER — METOPROLOL TARTRATE 100 MG PO TABS: 100.0000 mg | ORAL_TABLET | ORAL | 0 refills | Status: DC

## 2023-02-07 NOTE — Telephone Encounter (Signed)
Error

## 2023-02-07 NOTE — Telephone Encounter (Signed)
Returning patient's call about his CCTA.  Re-sent metoprolol for test and left message for patient to call back if he questions about the instructions.  Gordy Clement RN Navigator Cardiac Imaging [facility] [phone] Office [phone] Cell

## 2023-02-09 ENCOUNTER — Telehealth: Payer: Self-pay | Admitting: Cardiology

## 2023-02-09 MED ORDER — METOPROLOL TARTRATE 100 MG PO TABS: 100.0000 mg | ORAL_TABLET | ORAL | 0 refills | Status: DC

## 2023-02-09 NOTE — Telephone Encounter (Signed)
Sent Rx for metoprolol tartrate 100mg  x1 dose 2 hours prior to CT scan sent to [facility] in [place].

## 2023-02-09 NOTE — Telephone Encounter (Signed)
Pt c/o medication issue:  1. Name of Medication: metoprolol tartrate (LOPRESSOR) 100 MG tablet   2. How are you currently taking this medication (dosage and times per day)? Take 1 tablet (100 mg total) by mouth as directed. Take one tablet (2) hours before your CT scan   3. Are you having a reaction (difficulty breathing--STAT)? No   4. What is your medication issue? Please send medication to [place], [place]

## 2023-02-10 NOTE — Progress Notes (Signed)
Addendum: Reviewed and agree with assessment and management plan. Nazaret Chea M, MD  

## 2023-02-12 ENCOUNTER — Ambulatory Visit (HOSPITAL_COMMUNITY)
Admission: RE | Admit: 2023-02-12 | Discharge: 2023-02-12 | Disposition: A | Discharge status: Home / Self Care | Payer: 59 | Source: Ambulatory Visit | Attending: Cardiology | Admitting: Cardiology

## 2023-02-12 ENCOUNTER — Other Ambulatory Visit: Payer: Self-pay | Admitting: Cardiovascular Disease

## 2023-02-12 ENCOUNTER — Ambulatory Visit (HOSPITAL_BASED_OUTPATIENT_CLINIC_OR_DEPARTMENT_OTHER)
Admission: RE | Admit: 2023-02-12 | Discharge: 2023-02-12 | Disposition: A | Discharge status: Home / Self Care | Payer: 59 | Source: Ambulatory Visit | Attending: Cardiovascular Disease | Admitting: Cardiovascular Disease

## 2023-02-12 DIAGNOSIS — I2510 Atherosclerotic heart disease of native coronary artery without angina pectoris: Secondary | ICD-10-CM | POA: Diagnosis not present

## 2023-02-12 DIAGNOSIS — R931 Abnormal findings on diagnostic imaging of heart and coronary circulation: Secondary | ICD-10-CM

## 2023-02-12 DIAGNOSIS — R072 Precordial pain: Secondary | ICD-10-CM | POA: Diagnosis not present

## 2023-02-12 MED ORDER — NITROGLYCERIN 0.4 MG SL SUBL
SUBLINGUAL_TABLET | SUBLINGUAL | Status: AC
  Administered 2023-02-12: 0.8 mg via SUBLINGUAL
  Filled 2023-02-12: qty 2

## 2023-02-12 MED ORDER — NITROGLYCERIN 0.4 MG SL SUBL: 0.8000 mg | SUBLINGUAL_TABLET | Freq: Once | SUBLINGUAL | Status: AC

## 2023-02-12 MED ORDER — IOHEXOL 350 MG/ML SOLN
100.0000 mL | Freq: Once | INTRAVENOUS | Status: AC | PRN
  Administered 2023-02-12: 100 mL via INTRAVENOUS

## 2023-02-19 ENCOUNTER — Telehealth: Payer: Self-pay | Admitting: Cardiology

## 2023-02-19 MED ORDER — ROSUVASTATIN CALCIUM 10 MG PO TABS: 10.0000 mg | ORAL_TABLET | Freq: Every day | ORAL | 3 refills | Status: DC

## 2023-02-19 MED ORDER — ISOSORBIDE MONONITRATE ER 30 MG PO TB24: 30.0000 mg | ORAL_TABLET | Freq: Every day | ORAL | 3 refills | Status: DC

## 2023-02-19 MED ORDER — ASPIRIN 81 MG PO TBEC: 81.0000 mg | DELAYED_RELEASE_TABLET | Freq: Every day | ORAL | Status: AC

## 2023-02-19 NOTE — Telephone Encounter (Signed)
Patient stated he was advised to start on new medication but does not know the name of the medication.  Patient has appointment on 4/22.

## 2023-02-19 NOTE — Telephone Encounter (Signed)
Returned call to patient using interpreter services (agent ID [id number]).  Discussed results of recent CT scan. Per Dr. Anne Fu: Coronary artery disease noted on CT scan. Let's start Imdur 30mg  PO QD. Start Crestor 10mg  once a day Start Aspirin 81mg  once a day   Have him return for appt with APP in 2 months with lipids and CMET. If chest tightness worsens, let us know. Brian Schultz, MD   Medications ordered and Rx sent to pharmacy. Explained to patient that he will need to pick up aspirin 81mg  over the counter at his pharmacy.  Patient has follow-up appointment with Dr. Anne Fu scheduled for 03/05/23.   Patient verbalized understanding and expressed appreciation for call.

## 2023-02-21 ENCOUNTER — Telehealth: Payer: Self-pay | Admitting: Cardiology

## 2023-02-21 NOTE — Telephone Encounter (Signed)
Called pt back using Language Line - pt reports he has been having headaches, dizziness and fatigue since starting Isosorbide and Crestor and is not sure which medication is causing his troubles.  Informed pt these are common side effects of Isosorbide.  Requested he decrease the isosorbide to 1/2 tablet in the morning to see if that helps with his s/s.  Pt states understanding. He has not been checking his BP at home but advised to start if possible.  He will keep his upcoming appt on 4/22 as scheduled.

## 2023-02-21 NOTE — Telephone Encounter (Signed)
Pt c/o medication issue:  1. Name of Medication:   isosorbide mononitrate (IMDUR) 30 MG 24 hr tablet   rosuvastatin (CRESTOR) 10 MG tablet   2. How are you currently taking this medication (dosage and times per day)? Take 1 tablet (30 mg total) by mouth daily.    Take 1 tablet (10 mg total) by mouth daily.    3. Are you having a reaction (difficulty breathing--STAT)? No  4. What is your medication issue? Pt stated these two medications have been giving him headaches, making him very fatigue and causing some dizziness and he would like callback on what he should do. Please advise.

## 2023-03-05 ENCOUNTER — Encounter: Payer: Self-pay | Admitting: Cardiology

## 2023-03-05 ENCOUNTER — Ambulatory Visit: Payer: 59 | Attending: Cardiology | Admitting: Cardiology

## 2023-03-05 VITALS — BP 111/72 | HR 84 | Ht 64.0 in | Wt 138.6 lb

## 2023-03-05 DIAGNOSIS — I2510 Atherosclerotic heart disease of native coronary artery without angina pectoris: Secondary | ICD-10-CM | POA: Diagnosis not present

## 2023-03-05 DIAGNOSIS — R0602 Shortness of breath: Secondary | ICD-10-CM

## 2023-03-05 DIAGNOSIS — R072 Precordial pain: Secondary | ICD-10-CM

## 2023-03-05 NOTE — Patient Instructions (Signed)
Medication Instructions:  Your physician recommends that you continue on your current medications as directed. Please refer to the Current Medication list given to you today.  *If you need a refill on your cardiac medications before your next appointment, please call your pharmacy*  Lab Work: TODAY: Lipids If you have labs (blood work) drawn today and your tests are completely normal, you will receive your results only by: MyChart Message (if you have MyChart) OR A paper copy in the mail If you have any lab test that is abnormal or we need to change your treatment, we will call you to review the results.  Follow-Up: At [facility], you and your health needs are our priority.  As part of our continuing mission to provide you with exceptional heart care, we have created designated Provider Care Teams.  These Care Teams include your primary Cardiologist (physician) and Advanced Practice Providers (APPs -  Physician Assistants and Nurse Practitioners) who all work together to provide you with the care you need, when you need it.  Your next appointment:   6 month(s)  Provider:   Jari Favre, PA-C, Ronie Spies, PA-C, Robin Searing, NP, Jacolyn Reedy, PA-C, Eligha Bridegroom, NP, or Tereso Newcomer, PA-C

## 2023-03-05 NOTE — Progress Notes (Signed)
Cardiology Office Note:    Date:  03/05/2023   ID:  Brian Preston, DOB May 07, 1971, MRN [id number]  PCP:  Erick Alley, DO   [facility] Providers Cardiologist:  None     Referring MD: Erick Alley, DO    History of Present Illness:    Brian Preston is a 52 y.o. male here for the evaluation of chest pain at the request of Dr. Perley Jain.  Original visit: Chest pain. At night wake up with gasps. Now chest tightness. Non exertional for the most part.  Mild shortness of breath with activity.  Moderate intensity discomfort.  This worries him.  Works at [employer] Non smoker Mother had CABG, CVA He has 2 children.    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  Has upcoming appointment referrals in place.  He has had colonoscopy in 2021 at age 67 with Dr. Chestine Spore of GI.  This was normal.  Following original visit we proceeded with: Coronary CT 02/12/23: 1. Coronary calcium score of 76.9. This was 87th percentile for age-, sex, and race-matched controls.   2. Total plaque volume 201 mm3 which is 60th percentile for age- and sex-matched controls (calcified plaque 5 mm3; non-calcified plaque 138mm3). TPV is moderate. Based on this, would recommend LDL goal <70.   2. Normal coronary origin with right dominance.   3. There is severe (>70%) mixed plaque in the LCX and moderate (50-69%) mixed plaque in the proximal LAD. CAD-RADS 4. Will send for FFRct.  FFR: 1. Left Main: FFRct 0.99. Low likelihood of hemodynamic significance.   2. LAD: FFRct 0.96 proximal, 0.8 mid, 0.76 distal. Borderline likelihood of hemodynamic significance. 3. LCX: FFrct 0.99. Low likelihood of hemodynamic significance. OM2 FFRct 0.9 proximal, 0.65 mid. High likelihood of hemodynamic significance.   4. RCA: FFRct 0.98 proximal, 0.95 mid, 0.95 distal. Low likelihood of hemodynamic significance.   IMPRESSION:   1.  CT FFR analysis demonstrates significant stenosis in  OM2.  I personally reviewed and showed him the images of the FFR analysis.  Expressed the importance of medication management.  He did experience headache with 30 mg of isosorbide.  After decreasing this or cutting the pill in half to 15 mg he feels better.  He is taking his Crestor 10 mg a day.  Also taking aspirin 81 mg.   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  Current Medications: Current Meds  Medication Sig   aspirin EC 81 MG tablet Take 1 tablet (81 mg total) by mouth daily. Swallow whole.   isosorbide mononitrate (IMDUR) 30 MG 24 hr tablet Take 1 tablet (30 mg total) by mouth daily. (Patient taking differently: Take 15 mg by mouth daily.)   lisinopril-hydrochlorothiazide (ZESTORETIC) 10-12.5 MG tablet Take 1 tablet by mouth daily.   pantoprazole (PROTONIX) 40 MG tablet Take 1 tablet (40 mg total) by mouth 2 (two) times daily.   rosuvastatin (CRESTOR) 10 MG tablet Take 1 tablet (10 mg total) by mouth daily.     Allergies:   Patient has no known allergies.   Social History   Socioeconomic History   Marital status: Married    Spouse name: Tristan Schroeder   Number of children: 2   Years of education: Not on file   Highest  education level: Not on file  Occupational History   Not on file  Tobacco Use   Smoking status: Former    Types: Cigarettes    Quit date: 11/22/1992    Years since quitting: 30.3   Smokeless tobacco: Former    Quit date: 1992   Tobacco comments:    Quit about 30 years ago, was smoking about 1-2 pack per day for 10 years  Vaping Use   Vaping Use: Never used  Substance and Sexual Activity   Alcohol use: Yes    Alcohol/week: 1.0 standard drink of alcohol    Types: 1 Cans of beer per week    Comment: occ   Drug use: No   Sexual activity: Yes    Partners: Female  Other Topics Concern   Not on file  Social History Narrative    From [place] to [place] 1994   Lives with wife   Children - 2 - son and daughter   Social Determinants of Health   Financial Resource Strain: Not on file  Food Insecurity: Not on file  Transportation Needs: Not on file  Physical Activity: Not on file  Stress: Not on file  Social Connections: Not on file     Family History: The patient's family history includes Hypertension in his father and mother; Stroke (age of onset: 3) in his mother. There is no history of Colon cancer, Stomach cancer, Esophageal cancer, or Pancreatic cancer.  ROS:   Please see the history of present illness.     All other systems reviewed and are negative.  EKGs/Labs/Other Studies Reviewed:    The following studies were reviewed today: Prior office note lab work EKG reviewed  ECHO 01/22/23:    1. Left ventricular ejection fraction, by estimation, is 60 to 65%. The  left ventricle has normal function. The left ventricle has no regional  wall motion abnormalities. Left ventricular diastolic parameters are  consistent with Grade I diastolic  dysfunction (impaired relaxation).   2. Right ventricular systolic function is normal. The right ventricular  size is normal. There is normal pulmonary artery systolic pressure.   3. Likely hepatic cyst with hyperechoic central structure measuring 3.6x  2.3 cm. Consider dedicated liver ultrasound.   4. The mitral valve is normal in structure. Trivial mitral valve  regurgitation. No evidence of mitral stenosis.   5. The aortic valve is tricuspid. Aortic valve regurgitation is not  visualized. No aortic stenosis is present.   6. The inferior vena cava is normal in size with greater than 50%  respiratory variability, suggesting right atrial pressure of 3 mmHg.   Liver US: 12/22/22: FINDINGS: Gallbladder:   No gallstones or wall thickening visualized. No sonographic Murphy sign noted by sonographer.   Common bile duct:   Diameter: 3.2 mm   Liver:   No focal lesion  identified. Within normal limits in parenchymal echogenicity. Portal vein is patent on color Doppler imaging with normal direction of blood flow towards the liver.   Other: None.   IMPRESSION: Normal study. No focal liver lesions identified.   EKG: 11/27/2022: Sinus rhythm 92 no other abnormalities  Recent Labs: 11/27/2022: BUN 18; Creatinine, Ser 0.86; Hemoglobin 16.8; Platelets 337; Potassium 4.3; Sodium 141 12/05/2022: ALT 14  Recent Lipid Panel    Component Value Date/Time   CHOL 189 11/27/2022 1531   TRIG 272 (H) 11/27/2022 1531   HDL 39 (L) 11/27/2022 1531   CHOLHDL 4.8 11/27/2022 1531   CHOLHDL 4.7 11/25/2015 1431   VLDL 28  11/25/2015 1431   LDLCALC 104 (H) 11/27/2022 1531     Risk Assessment/Calculations:                Physical Exam:    VS:  BP 111/72   Pulse 84   Ht  (1.626 m)   Wt 138 lb 9.6 oz (62.9 kg)   SpO2 98%   BMI 23.79 kg/m     Wt Readings from Last 3 Encounters:  03/05/23 138 lb 9.6 oz (62.9 kg)  02/05/23 139 lb (63 kg)  01/24/23 137 lb (62.1 kg)     GEN:  Well nourished, well developed in no acute distress HEENT: Normal NECK: No JVD; No carotid bruits LYMPHATICS: No lymphadenopathy CARDIAC: RRR, no murmurs, rubs, gallops RESPIRATORY:  Clear to auscultation without rales, wheezing or rhonchi  ABDOMEN: Soft, non-tender, non-distended MUSCULOSKELETAL:  No edema; No deformity  SKIN: Warm and dry NEUROLOGIC:  Alert and oriented x 3 PSYCHIATRIC:  Normal affect   ASSESSMENT:    1. CAD in native artery   2. Precordial pain   3. Shortness of breath     PLAN:    In order of problems listed above:  CAD on CT coronary 02/12/23 with severe OM2 disease Precordial chest pain/chest tightness - Mother had bypass surgery.  Former smoker several years ago.  Chest tightness.  Tried Isosorbide and Crestor - Had dizzy spell. Decreased isosorbide in half to 15 mg.  Feels fine on this dose. -ASA 81 -Crestor 10 -Normal EF Expressed the  importance of secondary prevention for him.  Understands plaque stabilization.  We discussed if chest pain recurs or becomes more prevalent that we can always consider cardiac catheterization.    Shortness of breath - Occasionally will wake up gasping for breath.  Has noted some increased shortness of breath with activity.  Echocardiogram normal EF. CBD 3.2cm.   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  Liver enzymes are normal.  Recent LDL 104 TSH 2.3 creatinine 0.8 from outside labs.  Rechecking lipid panel since being on Crestor 10 mg.         Medication Adjustments/Labs and Tests Ordered: Current medicines are reviewed at length with the patient today.  Concerns regarding medicines are outlined above.  Orders Placed This Encounter  Procedures   Lipid panel   No orders of the defined types were placed in this encounter.   Patient Instructions  Medication Instructions:  Your physician recommends that you continue on your current medications as directed. Please refer to the Current Medication list given to you today.  *If you need a refill on your cardiac medications before your next appointment, please call your pharmacy*  Lab Work: TODAY: Lipids If you have labs (blood work) drawn today and your tests are completely normal, you will receive your results only by: MyChart Message (if you have MyChart) OR A paper copy in the mail If you have any lab test that is abnormal or we need to change your treatment, we will call you to review the results.  Follow-Up: At [facility], you and your health needs are our priority.  As part of our continuing mission to provide you with exceptional heart care, we have created designated Provider Care Teams.  These Care Teams include your primary Cardiologist (physician) and Advanced Practice Providers (APPs -   Physician Assistants and Nurse Practitioners) who all work  together to provide you with the care you need, when you need it.  Your next appointment:   6 month(s)  Provider:   Jari Favre, PA-C, Ronie Spies, PA-C, Robin Searing, NP, Jacolyn Reedy, PA-C, Eligha Bridegroom, NP, or Tereso Newcomer, PA-C        Signed, Donato Schultz, MD  03/05/2023 2:32 PM    [facility]

## 2023-03-06 LAB — LIPID PANEL
Chol/HDL Ratio: 2.7 ratio (ref 0.0–5.0)
Cholesterol, Total: 114 mg/dL (ref 100–199)
HDL: 42 mg/dL (ref >39)
LDL Chol Calc (NIH): 48 mg/dL (ref 0–99)
Triglycerides: 138 mg/dL (ref 0–149)
VLDL Cholesterol Cal: 24 mg/dL (ref 5–40)

## 2023-03-12 ENCOUNTER — Ambulatory Visit (AMBULATORY_SURGERY_CENTER): Payer: 59 | Admitting: Internal Medicine

## 2023-03-12 ENCOUNTER — Encounter: Payer: Self-pay | Admitting: Internal Medicine

## 2023-03-12 VITALS — BP 102/70 | HR 81 | Temp 98.4°F | Resp 14 | Ht 64.0 in | Wt 137.0 lb

## 2023-03-12 DIAGNOSIS — K2970 Gastritis, unspecified, without bleeding: Secondary | ICD-10-CM

## 2023-03-12 DIAGNOSIS — K259 Gastric ulcer, unspecified as acute or chronic, without hemorrhage or perforation: Secondary | ICD-10-CM

## 2023-03-12 DIAGNOSIS — I10 Essential (primary) hypertension: Secondary | ICD-10-CM | POA: Diagnosis not present

## 2023-03-12 DIAGNOSIS — K219 Gastro-esophageal reflux disease without esophagitis: Secondary | ICD-10-CM | POA: Diagnosis not present

## 2023-03-12 DIAGNOSIS — R1013 Epigastric pain: Secondary | ICD-10-CM

## 2023-03-12 DIAGNOSIS — Z8619 Personal history of other infectious and parasitic diseases: Secondary | ICD-10-CM

## 2023-03-12 DIAGNOSIS — K319 Disease of stomach and duodenum, unspecified: Secondary | ICD-10-CM

## 2023-03-12 MED ORDER — SODIUM CHLORIDE 0.9 % IV SOLN: 500.0000 mL | Freq: Once | INTRAVENOUS | Status: DC

## 2023-03-12 NOTE — Patient Instructions (Signed)
Resume all of your current medications today as ordered.  YOU HAD AN ENDOSCOPIC PROCEDURE TODAY AT THE [facility]:   Refer to the procedure report that was given to you for any specific questions about what was found during the examination.  If the procedure report does not answer your questions, please call your gastroenterologist to clarify.  If you requested that your care partner not be given the details of your procedure findings, then the procedure report has been included in a sealed envelope for you to review at your convenience later.  YOU SHOULD EXPECT: Some feelings of bloating in the abdomen. Passage of more gas than usual.  Walking can help get rid of the air that was put into your GI tract during the procedure and reduce the bloating.  Please Note:  You might notice some irritation and congestion in your nose or some drainage.  This is from the oxygen used during your procedure.  There is no need for concern and it should clear up in a day or so.  SYMPTOMS TO REPORT IMMEDIATELY:  Following upper endoscopy (EGD)  Vomiting of blood or coffee ground material  New chest pain or pain under the shoulder blades  Painful or persistently difficult swallowing  New shortness of breath  Fever of 100F or higher  Black, tarry-looking stools  For urgent or emergent issues, a gastroenterologist can be reached at any hour by calling [phone]. Do not use MyChart messaging for urgent concerns.    DIET:  We do recommend a small meal at first, but then you may proceed to your regular diet.  Drink plenty of fluids but you should avoid alcoholic beverages for 24 hours.  Try to avoid spicy and acidic foods.  ACTIVITY:  You should plan to take it easy for the rest of today and you should NOT DRIVE or use heavy machinery until tomorrow (because of the sedation medicines used during the test).    FOLLOW UP: Our staff will call the number listed on your records the next business day  following your procedure.  We will call around 7:15- 8:00 am to check on you and address any questions or concerns that you may have regarding the information given to you following your procedure. If we do not reach you, we will leave a message.     If any biopsies were taken you will be contacted by phone or by letter within the next 1-3 weeks.  Please call us at [phone] if you have not heard about the biopsies in 3 weeks.    SIGNATURES/CONFIDENTIALITY: You and/or your care partner have signed paperwork which will be entered into your electronic medical record.  These signatures attest to the fact that that the information above on your After Visit Summary has been reviewed and is understood.  Full responsibility of the confidentiality of this discharge information lies with you and/or your care-partner.

## 2023-03-12 NOTE — Progress Notes (Signed)
VS by DT  Pt's states no medical or surgical changes since previsit or office visit.  

## 2023-03-12 NOTE — Progress Notes (Signed)
Pt resting comfortably. VSS. Airway intact. SBAR complete to RN. All questions answered.   

## 2023-03-12 NOTE — Progress Notes (Signed)
GASTROENTEROLOGY PROCEDURE H&P NOTE   Primary Care Physician: Erick Alley, DO    Reason for Procedure:  Epigastric pain, GERD  Plan:    EGD  Patient is appropriate for endoscopic procedure(s) in the ambulatory ([facility]) setting.  The nature of the procedure, as well as the risks, benefits, and alternatives were carefully and thoroughly reviewed with the patient. Ample time for discussion and questions allowed. The patient understood, was satisfied, and agreed to proceed.     HPI: Christine Schiefelbein is a 52 y.o. male who presents for EGD.  Medical history as below. No recent chest pain or shortness of breath.  No abdominal pain today.  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 No pertinent surgical history.  Prior to Admission medications   Medication Sig Start Date End Date Taking? Authorizing Provider  aspirin EC 81 MG tablet Take 1 tablet (81 mg total) by mouth daily. Swallow whole. 02/19/23  Yes Jake Bathe, MD  isosorbide mononitrate (IMDUR) 30 MG 24 hr tablet Take 1 tablet (30 mg total) by mouth daily. Patient taking differently: Take 15 mg by mouth daily. 02/19/23  Yes Jake Bathe, MD  lisinopril-hydrochlorothiazide (ZESTORETIC) 10-12.5 MG tablet Take 1 tablet by mouth daily. 01/25/22  Yes Welborn, Ryan, DO  pantoprazole (PROTONIX) 40 MG tablet Take 1 tablet (40 mg total) by mouth 2 (two) times daily. 01/24/23  Yes Unk Lightning, PA  rosuvastatin (CRESTOR) 10 MG tablet Take 1 tablet (10 mg total) by mouth daily. 02/19/23  Yes Jake Bathe, MD    Current Outpatient Medications  Medication Sig Dispense Refill   aspirin EC 81 MG tablet Take 1 tablet (81 mg total) by mouth daily. Swallow whole.     isosorbide mononitrate (IMDUR) 30 MG 24 hr tablet Take 1 tablet (30 mg total) by mouth daily. (Patient taking differently: Take 15 mg by mouth daily.) 90 tablet  3   lisinopril-hydrochlorothiazide (ZESTORETIC) 10-12.5 MG tablet Take 1 tablet by mouth daily. 90 tablet 3   pantoprazole (PROTONIX) 40 MG tablet Take 1 tablet (40 mg total) by mouth 2 (two) times daily. 60 tablet 3   rosuvastatin (CRESTOR) 10 MG tablet Take 1 tablet (10 mg total) by mouth daily. 90 tablet 3   Current Facility-Administered Medications  Medication Dose Route Frequency Provider Last Rate Last Admin   0.9 %  sodium chloride infusion  500 mL Intravenous Once Corlette Ciano, Carie Caddy, MD        Allergies as of 03/12/2023   (No Known Allergies)    Family History  Problem Relation Age of Onset   Hypertension Mother    Stroke Mother 25   Hypertension Father    Colon cancer Neg Hx    Stomach cancer Neg Hx    Esophageal cancer Neg Hx    Pancreatic cancer Neg Hx    Rectal cancer Neg Hx     Social History   Socioeconomic History   Marital status: Married    Spouse name: Tristan Schroeder   Number of children: 2   Years of education: Not on file   Highest education level: Not on file  Occupational History   Not on file  Tobacco Use   Smoking status: Former    Types: Cigarettes    Quit date: 11/22/1992    Years since quitting:  30.3   Smokeless tobacco: Former    Quit date: 1992   Tobacco comments:    Quit about 30 years ago, was smoking about 1-2 pack per day for 10 years  Vaping Use   Vaping Use: Never used  Substance and Sexual Activity   Alcohol use: Yes    Alcohol/week: 1.0 standard drink of alcohol    Types: 1 Cans of beer per week    Comment: occ   Drug use: No   Sexual activity: Yes    Partners: Female  Other Topics Concern   Not on file  Social History Narrative   From [place] to [place] 1994   Lives with wife   Children - 2 - son and daughter   Social Determinants of Health   Financial Resource Strain: Not on file  Food Insecurity: Not on file  Transportation Needs: Not on file  Physical Activity: Not on file  Stress: Not on file  Social Connections: Not on file   Intimate Partner Violence: Not on file    Physical Exam: Vital signs in last 24 hours: @BP  110/68   Pulse 80   Temp 98.4 F (36.9 C) (Skin)   Ht 5\' 4"  (1.626 m)   Wt 137 lb (62.1 kg)   SpO2 97%   BMI 23.52 kg/m  GEN: NAD EYE: Sclerae anicteric ENT: MMM CV: Non-tachycardic Pulm: CTA b/l GI: Soft, NT/ND NEURO:  Alert & Oriented x 3   Erick Blinks, MD [facility]  03/12/2023 9:44 AM

## 2023-03-12 NOTE — Op Note (Signed)
[facility] Patient Name: Brian Preston Procedure Date: 03/12/2023 9:52 AM MRN: [id number] Endoscopist: Beverley Fiedler , MD, [id number] Age: 52 Referring MD:  Date of Birth: Nov 16, 1970 Gender: Male Account #: [id number] Procedure:                Upper GI endoscopy Indications:              Epigastric abdominal pain, personal hx of chronic                            inactive hepatitis B; imaging by MRI in 2021                            suggested nodular contour to the liver, subsequent                            [place] including recent shows normal liver Medicines:                Monitored Anesthesia Care Procedure:                Pre-Anesthesia Assessment:                           - Prior to the procedure, a History and Physical                            was performed, and patient medications and                            allergies were reviewed. The patient's tolerance of                            previous anesthesia was also reviewed. The risks                            and benefits of the procedure and the sedation                            options and risks were discussed with the patient.                            All questions were answered, and informed consent                            was obtained. Prior Anticoagulants: The patient has                            taken no anticoagulant or antiplatelet agents. ASA                            Grade Assessment: II - A patient with mild systemic                            disease. After reviewing the risks and benefits,  the patient was deemed in satisfactory condition to                            undergo the procedure.                           After obtaining informed consent, the endoscope was                            passed under direct vision. Throughout the                            procedure, the patient's blood pressure, pulse, and                            oxygen saturations were  monitored continuously. The                            GIF [id number] #[id number] was introduced through the                            mouth, and advanced to the second part of duodenum.                            The upper GI endoscopy was accomplished without                            difficulty. The patient tolerated the procedure                            well. Scope In: Scope Out: Findings:                 The examined esophagus was normal.                           Moderate inflammation characterized by erosions,                            erythema and nodularity was found in the gastric                            antrum and in the prepyloric region of the stomach.                            Biopsies were taken with a cold forceps for                            histology and Helicobacter pylori testing.                           The exam of the stomach was otherwise normal.                           Biopsies were taken with a cold forceps in  the                            gastric fundus and in the gastric body for                            Helicobacter pylori testing.                           The examined duodenum was normal. Complications:            No immediate complications. Estimated Blood Loss:     Estimated blood loss was minimal. Impression:               - Normal esophagus.                           - Acute gastritis. Biopsied.                           - Normal examined duodenum.                           - Biopsies were taken with a cold forceps for                            Helicobacter pylori testing.                           - No changes of portal HTN. Recommendation:           - Patient has a contact number available for                            emergencies. The signs and symptoms of potential                            delayed complications were discussed with the                            patient. Return to normal activities tomorrow.                             Written discharge instructions were provided to the                            patient.                           - Resume previous diet.                           - Continue present medications.                           - Await pathology results.                           - Avoid NSAIDs.                           -  At next [place] would add elastography as question of                            cirrhosis raised, though no evidence of portal HTN                            today or in the past (platelets normal, check INR                            with next labs). Beverley Fiedler, MD 03/12/2023 10:13:34 AM This report has been signed electronically.

## 2023-03-12 NOTE — Progress Notes (Signed)
Called to room to assist during endoscopic procedure.  Patient ID and intended procedure confirmed with present staff. Received instructions for my participation in the procedure from the performing physician.  

## 2023-03-15 ENCOUNTER — Encounter: Payer: Self-pay | Admitting: Internal Medicine

## 2023-03-19 DIAGNOSIS — Q181 Preauricular sinus and cyst: Secondary | ICD-10-CM | POA: Diagnosis not present

## 2023-03-20 ENCOUNTER — Other Ambulatory Visit: Payer: Self-pay

## 2023-03-20 DIAGNOSIS — I10 Essential (primary) hypertension: Secondary | ICD-10-CM

## 2023-03-21 MED ORDER — LISINOPRIL-HYDROCHLOROTHIAZIDE 10-12.5 MG PO TABS: 1.0000 | ORAL_TABLET | Freq: Every day | ORAL | 3 refills | Status: DC

## 2023-04-12 DIAGNOSIS — Z302 Encounter for sterilization: Secondary | ICD-10-CM | POA: Diagnosis not present

## 2023-05-14 DIAGNOSIS — I2510 Atherosclerotic heart disease of native coronary artery without angina pectoris: Secondary | ICD-10-CM | POA: Diagnosis not present

## 2023-05-14 DIAGNOSIS — Q181 Preauricular sinus and cyst: Secondary | ICD-10-CM | POA: Diagnosis not present

## 2023-05-14 DIAGNOSIS — K7460 Unspecified cirrhosis of liver: Secondary | ICD-10-CM | POA: Diagnosis not present

## 2023-05-28 ENCOUNTER — Encounter: Payer: Self-pay | Admitting: Physician Assistant

## 2023-05-28 DIAGNOSIS — I2510 Atherosclerotic heart disease of native coronary artery without angina pectoris: Secondary | ICD-10-CM | POA: Insufficient documentation

## 2023-05-28 NOTE — H&P (View-Only) (Signed)
Cardiology Office Note:    Date:  05/29/2023  ID:  Brian Preston, DOB Apr 02, 1971, MRN [id number] PCP: Erick Alley, DO  [facility] Providers Cardiologist:  Donato Schultz, MD       Patient Profile:      Coronary artery disease TTE 01/22/2023: EF 60-65, no RWMA, Gr 1 DD, normal RVSF, normal PASP, trivial MR, RAP 3 CCTA 02/12/2023: CAC score 76.9 (87 percentile); total plaque volume 60th percentile (moderate) LAD proximal 50-69, mid <25, D1 <25; LCx proximal <25, OM 2 proximal >70; LAD FFR mid 0.8, distal 0.76 (borderline), mid OM2 0.65 (high likelihood)>>Med Rx Hypertension FHx CAD Hepatitis B Hepatic cirrhosis      History of Present Illness:   Brian Preston is a 52 y.o. male who returns for follow-up of CAD.  He was last seen by Dr. Anne Fu 03/05/2023.  He was stable on antianginal regimen.  Plan was to proceed with cardiac catheterization if he had recurrent or worsening pain.  He is seen with the help of an interpreter.  Over the past month, he has noted fairly constant substernal chest discomfort.  He has not really noticed any significant worsening with activity.  He has not had any relief.  He does not have nitroglycerin.  He has not had any radiating symptoms, associated nausea or diaphoresis.  He has not had pleuritic symptoms.  He has not had symptoms with meals.  He notes occasional shortness of breath.  He also notes shortness of breath with exertion.  He has not had orthopnea, syncope, lower extremity edema.  Review of Systems  Constitutional: Negative for chills and fever.  Respiratory:  Negative for cough.   Gastrointestinal:  Negative for diarrhea, hematochezia, melena and vomiting.  Genitourinary:  Negative for hematuria.   see HPI    Studies Reviewed:   EKG Interpretation Date/Time:  Tuesday May 29 2023 08:20:48 EDT Ventricular Rate:  87 PR Interval:  146 QRS Duration:  80 QT Interval:  344 QTC Calculation: 413 R Axis:   57  Text Interpretation: Normal sinus rhythm  Early repolarization Normal ECG When compared with ECG of 27-Nov-2022 15:57, No significant change was found Confirmed by Tereso Newcomer [phone]) on 05/29/2023 8:39:41 AM    Risk Assessment/Calculations:           Physical Exam:   VS:  BP 110/80   Pulse 84   Ht 5\' 4"  (1.626 m)   Wt 135 lb (61.2 kg)   SpO2 93%   BMI 23.17 kg/m    Wt Readings from Last 3 Encounters:  05/29/23 135 lb (61.2 kg)  03/12/23 137 lb (62.1 kg)  03/05/23 138 lb 9.6 oz (62.9 kg)    Constitutional:      Appearance: Healthy appearance. Not in distress.  Neck:     Vascular: No carotid bruit or JVR. JVD normal.  Pulmonary:     Breath sounds: Normal breath sounds. No wheezing. No rales.  Cardiovascular:     Normal rate. Regular rhythm.     Murmurs: There is no murmur.  Edema:    Peripheral edema absent.  Abdominal:     Palpations: Abdomen is soft.       ASSESSMENT AND PLAN:   CAD (coronary artery disease) Coronary CTA in April 2024 demonstrated probable hemodynamically significant stenosis in OM 2.  He has been managed medically with plans to proceed with cardiac catheterization if he has recurrent symptoms.  Over the past month, he has noted fairly constant chest discomfort that seems cardiac.  EKG is unchanged. He has chronic early repolarization. His blood pressure limits titration of antianginal medications.  I do think he could tolerate the addition of low-dose beta-blocker.  I have also recommended proceeding with cardiac catheterization as previously suggested.  I discussed this with Dr. Nelly Laurence (attending MD) who agreed.   Continue ASA 81 mg daily, Imdur 15 mg daily, Crestor 10 mg daily.   Add Toprol-XL 12.5 mg daily, nitroglycerin as needed chest pain.   Proceed with cardiac catheterization.   Follow-up after catheterization.  Essential hypertension Blood pressure well-controlled.  Continue Imdur 15 mg daily, lisinopril/HCTZ 10/12.5 mg daily.  Pure hypercholesterolemia Goal LDL <55.  LDL in April  2024 was optimal at 48.  Continue Crestor 10 mg daily.    Informed Consent   Shared Decision Making/Informed Consent The risks [stroke (1 in 1000), death (1 in 1000), kidney failure [usually temporary] (1 in 500), bleeding (1 in 200), allergic reaction [possibly serious] (1 in 200)], benefits (diagnostic support and management of coronary artery disease) and alternatives of a cardiac catheterization were discussed in detail with Mr. Kirksey and he is willing to proceed.     Dispo:  Return in about 2 weeks (around 06/12/2023) for Post Procedure Follow Up.  Signed, Tereso Newcomer, PA-C

## 2023-05-28 NOTE — Progress Notes (Signed)
Cardiology Office Note:    Date:  05/29/2023  ID:  Brian Preston, DOB [birth year], MRN [id number] PCP: Erick Alley, DO  [facility] Providers Cardiologist:  Donato Schultz, MD       Patient Profile:      Coronary artery disease TTE 01/22/2023: EF 60-65, no RWMA, Gr 1 DD, normal RVSF, normal PASP, trivial MR, RAP 3 CCTA 02/12/2023: CAC score 76.9 (87 percentile); total plaque volume 60th percentile (moderate) LAD proximal 50-69, mid <25, D1 <25; LCx proximal <25, OM 2 proximal >70; LAD FFR mid 0.8, distal 0.76 (borderline), mid OM2 0.65 (high likelihood)>>Med Rx Hypertension FHx CAD Hepatitis B Hepatic cirrhosis      History of Present Illness:   Brian Preston is a 52 y.o. male who returns for follow-up of CAD.  He was last seen by Dr. Anne Fu 03/05/2023.  He was stable on antianginal regimen.  Plan was to proceed with cardiac catheterization if he had recurrent or worsening pain.  He is seen with the help of an interpreter.  Over the past month, he has noted fairly constant substernal chest discomfort.  He has not really noticed any significant worsening with activity.  He has not had any relief.  He does not have nitroglycerin.  He has not had any radiating symptoms, associated nausea or diaphoresis.  He has not had pleuritic symptoms.  He has not had symptoms with meals.  He notes occasional shortness of breath.  He also notes shortness of breath with exertion.  He has not had orthopnea, syncope, lower extremity edema.  Review of Systems  Constitutional: Negative for chills and fever.  Respiratory:  Negative for cough.   Gastrointestinal:  Negative for diarrhea, hematochezia, melena and vomiting.  Genitourinary:  Negative for hematuria.   see HPI    Studies Reviewed:   EKG Interpretation Date/Time:  Tuesday May 29 2023 08:20:48 EDT Ventricular Rate:  87 PR Interval:  146 QRS Duration:  80 QT Interval:  344 QTC Calculation: 413 R Axis:   57  Text Interpretation: Normal sinus rhythm  Early repolarization Normal ECG When compared with ECG of 27-Nov-2022 15:57, No significant change was found Confirmed by Tereso Newcomer [phone]) on 05/29/2023 8:39:41 AM    Risk Assessment/Calculations:           Physical Exam:   VS:  BP 110/80   Pulse 84   Ht 5\' 4"  (1.626 m)   Wt 135 lb (61.2 kg)   SpO2 93%   BMI 23.17 kg/m    Wt Readings from Last 3 Encounters:  05/29/23 135 lb (61.2 kg)  03/12/23 137 lb (62.1 kg)  03/05/23 138 lb 9.6 oz (62.9 kg)    Constitutional:      Appearance: Healthy appearance. Not in distress.  Neck:     Vascular: No carotid bruit or JVR. JVD normal.  Pulmonary:     Breath sounds: Normal breath sounds. No wheezing. No rales.  Cardiovascular:     Normal rate. Regular rhythm.     Murmurs: There is no murmur.  Edema:    Peripheral edema absent.  Abdominal:     Palpations: Abdomen is soft.       ASSESSMENT AND PLAN:   CAD (coronary artery disease) Coronary CTA in April 2024 demonstrated probable hemodynamically significant stenosis in OM 2.  He has been managed medically with plans to proceed with cardiac catheterization if he has recurrent symptoms.  Over the past month, he has noted fairly constant chest discomfort that seems cardiac.  EKG is unchanged. He has chronic early repolarization. His blood pressure limits titration of antianginal medications.  I do think he could tolerate the addition of low-dose beta-blocker.  I have also recommended proceeding with cardiac catheterization as previously suggested.  I discussed this with Dr. Nelly Laurence (attending MD) who agreed.   Continue ASA 81 mg daily, Imdur 15 mg daily, Crestor 10 mg daily.   Add Toprol-XL 12.5 mg daily, nitroglycerin as needed chest pain.   Proceed with cardiac catheterization.   Follow-up after catheterization.  Essential hypertension Blood pressure well-controlled.  Continue Imdur 15 mg daily, lisinopril/HCTZ 10/12.5 mg daily.  Pure hypercholesterolemia Goal LDL <55.  LDL in April  2024 was optimal at 48.  Continue Crestor 10 mg daily.    Informed Consent   Shared Decision Making/Informed Consent The risks [stroke (1 in 1000), death (1 in 1000), kidney failure [usually temporary] (1 in 500), bleeding (1 in 200), allergic reaction [possibly serious] (1 in 200)], benefits (diagnostic support and management of coronary artery disease) and alternatives of a cardiac catheterization were discussed in detail with Mr. Thielman and he is willing to proceed.     Dispo:  Return in about 2 weeks (around 06/12/2023) for Post Procedure Follow Up.  Signed, Tereso Newcomer, PA-C

## 2023-05-29 ENCOUNTER — Ambulatory Visit: Payer: 59 | Attending: Physician Assistant | Admitting: Physician Assistant

## 2023-05-29 ENCOUNTER — Encounter: Payer: Self-pay | Admitting: Physician Assistant

## 2023-05-29 VITALS — BP 110/80 | HR 84 | Ht 64.0 in | Wt 135.0 lb

## 2023-05-29 DIAGNOSIS — I25119 Atherosclerotic heart disease of native coronary artery with unspecified angina pectoris: Secondary | ICD-10-CM

## 2023-05-29 DIAGNOSIS — I10 Essential (primary) hypertension: Secondary | ICD-10-CM

## 2023-05-29 DIAGNOSIS — E7800 Pure hypercholesterolemia, unspecified: Secondary | ICD-10-CM

## 2023-05-29 DIAGNOSIS — R072 Precordial pain: Secondary | ICD-10-CM

## 2023-05-29 MED ORDER — METOPROLOL SUCCINATE ER 25 MG PO TB24: 12.5000 mg | ORAL_TABLET | Freq: Every day | ORAL | 3 refills | Status: DC

## 2023-05-29 MED ORDER — NITROGLYCERIN 0.4 MG SL SUBL: 0.4000 mg | SUBLINGUAL_TABLET | SUBLINGUAL | 3 refills | Status: DC | PRN

## 2023-05-29 NOTE — Patient Instructions (Addendum)
Medication Instructions:  Your physician has recommended you make the following change in your medication:   START Toprol Xl 25 taking only 1/2 tablet = 12.5 mg daily  START Nitroglycerin 0.4 s/l tablet only as needed for chest pain.  See instructions below:  The proper use and anticipated side effects of nitroglycerine has been carefully explained.  If a single episode of chest pain is not relieved by one tablet, the patient will try another within 5 minutes; and if this doesn't relieve the pain, the patient is instructed to call 911 for transportation to an emergency department.   *If you need a refill on your cardiac medications before your next appointment, please call your pharmacy*   Lab Work: TODAY:  BMET & CBC  If you have labs (blood work) drawn today and your tests are completely normal, you will receive your results only by: MyChart Message (if you have MyChart) OR A paper copy in the mail If you have any lab test that is abnormal or we need to change your treatment, we will call you to review the results.   Testing/Procedures: Your physician has requested that you have a cardiac catheterization. Cardiac catheterization is used to diagnose and/or treat various heart conditions. Doctors may recommend this procedure for a number of different reasons. The most common reason is to evaluate chest pain. Chest pain can be a symptom of coronary artery disease (CAD), and cardiac catheterization can show whether plaque is narrowing or blocking your heart's arteries. This procedure is also used to evaluate the valves, as well as measure the blood flow and oxygen levels in different parts of your heart. For further information please visit [email or web address] Please follow instruction sheet, BELOW:        Cardiac/Peripheral Catheterization   You are scheduled for a Cardiac Catheterization on Wednesday, July 17 with Dr. Bryan Lemma.  1. Please arrive at the [facility] (Main Entrance  A) at [facility]: [address] [place], [place] [address] at 8:00 AM (This time is 2 hour(s) before your procedure to ensure your preparation). Free valet parking service is available. You will check in at ADMITTING. The support person will be asked to wait in the waiting room.  It is OK to have someone drop you off and come back when you are ready to be discharged.        Special note: Every effort is made to have your procedure done on time. Please understand that emergencies sometimes delay scheduled procedures.  2. Diet: Do not eat solid foods after midnight.  You may have clear liquids until 5 AM the day of the procedure.  3. Labs: You will need to have blood drawn on TODAY  4. Medication instructions in preparation for your procedure:   Contrast Allergy: No   DO NOT TAKE  Lisinopril-hydrochlorothiazide TODAY, 05/29/23, OR TOMORROW, 05/30/23   On the morning of your procedure, take Aspirin 81 mg and any morning medicines NOT listed above.  You may use sips of water.  5. Plan to go home the same day, you will only stay overnight if medically necessary. 6. You MUST have a responsible adult to drive you home. 7. An adult MUST be with you the first 24 hours after you arrive home. 8. Bring a current list of your medications, and the last time and date medication taken. 9. Bring ID and current insurance cards. 10.Please wear clothes that are easy to get on and off and wear slip-on shoes.  Thank  you for allowing [place] to care for you!   --  Invasive Cardiovascular services    Follow-Up: At [facility], you and your health needs are our priority.  As part of our continuing mission to provide you with exceptional heart care, we have created designated Provider Care Teams.  These Care Teams include your primary Cardiologist (physician) and Advanced Practice Providers (APPs -  Physician Assistants and Nurse Practitioners) who all work together to provide you with  the care you need, when you need it.  We recommend signing up for the patient portal called "MyChart".  Sign up information is provided on this After Visit Summary.  MyChart is used to connect with patients for Virtual Visits (Telemedicine).  Patients are able to view lab/test results, encounter notes, upcoming appointments, etc.  Non-urgent messages can be sent to your provider as well.   To learn more about what you can do with MyChart, go to [email or web address].    Your next appointment:   2 week(s)  Provider:   Donato Schultz, MD  or Tereso Newcomer, PA-C         Other Instructions

## 2023-05-29 NOTE — Assessment & Plan Note (Addendum)
Coronary CTA in April 2024 demonstrated probable hemodynamically significant stenosis in OM 2.  He has been managed medically with plans to proceed with cardiac catheterization if he has recurrent symptoms.  Over the past month, he has noted fairly constant chest discomfort that seems cardiac.  EKG is unchanged. He has chronic early repolarization. His blood pressure limits titration of antianginal medications.  I do think he could tolerate the addition of low-dose beta-blocker.  I have also recommended proceeding with cardiac catheterization as previously suggested.  I discussed this with Dr. Nelly Laurence (attending MD) who agreed.   Continue ASA 81 mg daily, Imdur 15 mg daily, Crestor 10 mg daily.   Add Toprol-XL 12.5 mg daily, nitroglycerin as needed chest pain.   Proceed with cardiac catheterization.   Follow-up after catheterization.

## 2023-05-29 NOTE — Assessment & Plan Note (Signed)
Blood pressure well-controlled.  Continue Imdur 15 mg daily, lisinopril/HCTZ 10/12.5 mg daily.

## 2023-05-29 NOTE — Assessment & Plan Note (Signed)
Goal LDL <55.  LDL in April 2024 was optimal at 48.  Continue Crestor 10 mg daily.

## 2023-05-30 ENCOUNTER — Other Ambulatory Visit (HOSPITAL_COMMUNITY): Payer: Self-pay

## 2023-05-30 ENCOUNTER — Encounter (HOSPITAL_COMMUNITY)
Admission: RE | Disposition: A | Discharge status: Home / Self Care | Payer: Self-pay | Source: Home / Self Care | Attending: Cardiology

## 2023-05-30 ENCOUNTER — Other Ambulatory Visit: Payer: Self-pay

## 2023-05-30 ENCOUNTER — Ambulatory Visit (HOSPITAL_COMMUNITY)
Admission: RE | Admit: 2023-05-30 | Discharge: 2023-05-30 | Disposition: A | Discharge status: Home / Self Care | Payer: 59 | Attending: Cardiology | Admitting: Cardiology

## 2023-05-30 DIAGNOSIS — Z8249 Family history of ischemic heart disease and other diseases of the circulatory system: Secondary | ICD-10-CM | POA: Insufficient documentation

## 2023-05-30 DIAGNOSIS — R072 Precordial pain: Secondary | ICD-10-CM

## 2023-05-30 DIAGNOSIS — I10 Essential (primary) hypertension: Secondary | ICD-10-CM | POA: Insufficient documentation

## 2023-05-30 DIAGNOSIS — Z7982 Long term (current) use of aspirin: Secondary | ICD-10-CM | POA: Diagnosis not present

## 2023-05-30 DIAGNOSIS — Z955 Presence of coronary angioplasty implant and graft: Secondary | ICD-10-CM | POA: Insufficient documentation

## 2023-05-30 DIAGNOSIS — I2510 Atherosclerotic heart disease of native coronary artery without angina pectoris: Secondary | ICD-10-CM | POA: Diagnosis present

## 2023-05-30 DIAGNOSIS — I25119 Atherosclerotic heart disease of native coronary artery with unspecified angina pectoris: Secondary | ICD-10-CM

## 2023-05-30 DIAGNOSIS — I25110 Atherosclerotic heart disease of native coronary artery with unstable angina pectoris: Secondary | ICD-10-CM | POA: Diagnosis not present

## 2023-05-30 DIAGNOSIS — Z79899 Other long term (current) drug therapy: Secondary | ICD-10-CM | POA: Diagnosis not present

## 2023-05-30 DIAGNOSIS — K7460 Unspecified cirrhosis of liver: Secondary | ICD-10-CM | POA: Diagnosis not present

## 2023-05-30 DIAGNOSIS — E7800 Pure hypercholesterolemia, unspecified: Secondary | ICD-10-CM | POA: Diagnosis not present

## 2023-05-30 HISTORY — PX: CORONARY STENT INTERVENTION: CATH118234

## 2023-05-30 HISTORY — PX: LEFT HEART CATH AND CORONARY ANGIOGRAPHY: CATH118249

## 2023-05-30 LAB — CBC
Hematocrit: 48.3 % (ref 37.5–51.0)
Hemoglobin: 15.8 g/dL (ref 13.0–17.7)
MCH: 28.2 pg (ref 26.6–33.0)
MCHC: 32.7 g/dL (ref 31.5–35.7)
MCV: 86 fL (ref 79–97)
Platelets: 308 10*3/uL (ref 150–450)
RBC: 5.61 x10E6/uL (ref 4.14–5.80)
RDW: 13.2 % (ref 11.6–15.4)
WBC: 8.1 10*3/uL (ref 3.4–10.8)

## 2023-05-30 LAB — BASIC METABOLIC PANEL
BUN/Creatinine Ratio: 18 (ref 9–20)
BUN: 16 mg/dL (ref 6–24)
CO2: 27 mmol/L (ref 20–29)
Calcium: 9.8 mg/dL (ref 8.7–10.2)
Chloride: 100 mmol/L (ref 96–106)
Creatinine, Ser: 0.89 mg/dL (ref 0.76–1.27)
Glucose: 82 mg/dL (ref 70–99)
Potassium: 3.9 mmol/L (ref 3.5–5.2)
Sodium: 139 mmol/L (ref 134–144)
eGFR: 104 mL/min/{1.73_m2} (ref >59)

## 2023-05-30 LAB — POCT ACTIVATED CLOTTING TIME
Activated Clotting Time: 360 seconds
Activated Clotting Time: 360 seconds

## 2023-05-30 SURGERY — LEFT HEART CATH AND CORONARY ANGIOGRAPHY
Anesthesia: LOCAL

## 2023-05-30 MED ORDER — SODIUM CHLORIDE 0.9% FLUSH: 3.0000 mL | Freq: Two times a day (BID) | INTRAVENOUS | Status: DC

## 2023-05-30 MED ORDER — HYDRALAZINE HCL 20 MG/ML IJ SOLN: 10.0000 mg | INTRAMUSCULAR | Status: DC | PRN

## 2023-05-30 MED ORDER — TICAGRELOR 90 MG PO TABS
90.0000 mg | ORAL_TABLET | Freq: Two times a day (BID) | ORAL | 2 refills | Status: DC
Start: 2023-05-31 — End: 2023-06-13
  Filled 2023-05-30: qty 60, 30d supply, fill #0

## 2023-05-30 MED ORDER — TICAGRELOR 90 MG PO TABS
ORAL_TABLET | ORAL | Status: DC | PRN
  Administered 2023-05-30: 180 mg via ORAL

## 2023-05-30 MED ORDER — MIDAZOLAM HCL 2 MG/2ML IJ SOLN
INTRAMUSCULAR | Status: AC
  Filled 2023-05-30: qty 2

## 2023-05-30 MED ORDER — HEPARIN SODIUM (PORCINE) 1000 UNIT/ML IJ SOLN
INTRAMUSCULAR | Status: DC | PRN
  Administered 2023-05-30 (×2): 3100 [IU] via INTRAVENOUS

## 2023-05-30 MED ORDER — FENTANYL CITRATE (PF) 100 MCG/2ML IJ SOLN
INTRAMUSCULAR | Status: DC | PRN
  Administered 2023-05-30 (×2): 25 ug via INTRAVENOUS

## 2023-05-30 MED ORDER — VERAPAMIL HCL 2.5 MG/ML IV SOLN
INTRAVENOUS | Status: AC
  Filled 2023-05-30: qty 2

## 2023-05-30 MED ORDER — VERAPAMIL HCL 2.5 MG/ML IV SOLN
INTRAVENOUS | Status: DC | PRN
  Administered 2023-05-30: 10 mL via INTRA_ARTERIAL

## 2023-05-30 MED ORDER — FENTANYL CITRATE (PF) 100 MCG/2ML IJ SOLN
INTRAMUSCULAR | Status: AC
  Filled 2023-05-30: qty 2

## 2023-05-30 MED ORDER — ONDANSETRON HCL 4 MG/2ML IJ SOLN: 4.0000 mg | Freq: Four times a day (QID) | INTRAMUSCULAR | Status: DC | PRN

## 2023-05-30 MED ORDER — ACETAMINOPHEN 325 MG PO TABS: 650.0000 mg | ORAL_TABLET | ORAL | Status: DC | PRN

## 2023-05-30 MED ORDER — TICAGRELOR 90 MG PO TABS
ORAL_TABLET | ORAL | Status: AC
  Filled 2023-05-30: qty 2

## 2023-05-30 MED ORDER — ROSUVASTATIN CALCIUM 40 MG PO TABS: 40.0000 mg | ORAL_TABLET | Freq: Every day | ORAL | 1 refills | Status: DC

## 2023-05-30 MED ORDER — SODIUM CHLORIDE 0.9 % IV SOLN: INTRAVENOUS | Status: DC

## 2023-05-30 MED ORDER — NITROGLYCERIN 1 MG/10 ML FOR IR/CATH LAB
INTRA_ARTERIAL | Status: DC | PRN
  Administered 2023-05-30: 100 ug via INTRACORONARY

## 2023-05-30 MED ORDER — ASPIRIN 81 MG PO CHEW: 81.0000 mg | CHEWABLE_TABLET | ORAL | Status: DC

## 2023-05-30 MED ORDER — HEPARIN (PORCINE) IN NACL 1000-0.9 UT/500ML-% IV SOLN
INTRAVENOUS | Status: DC | PRN
  Administered 2023-05-30 (×3): 500 mL via INTRA_ARTERIAL

## 2023-05-30 MED ORDER — SODIUM CHLORIDE 0.9 % WEIGHT BASED INFUSION: 3.0000 mL/kg/h | INTRAVENOUS | Status: AC

## 2023-05-30 MED ORDER — LIDOCAINE HCL (PF) 1 % IJ SOLN
INTRAMUSCULAR | Status: DC | PRN
  Administered 2023-05-30: 5 mL

## 2023-05-30 MED ORDER — MIDAZOLAM HCL 2 MG/2ML IJ SOLN
INTRAMUSCULAR | Status: DC | PRN
  Administered 2023-05-30: 1 mg via INTRAVENOUS
  Administered 2023-05-30: 2 mg via INTRAVENOUS

## 2023-05-30 MED ORDER — LIDOCAINE HCL (PF) 1 % IJ SOLN
INTRAMUSCULAR | Status: AC
  Filled 2023-05-30: qty 30

## 2023-05-30 MED ORDER — LABETALOL HCL 5 MG/ML IV SOLN: 10.0000 mg | INTRAVENOUS | Status: DC | PRN

## 2023-05-30 MED ORDER — SODIUM CHLORIDE 0.9 % WEIGHT BASED INFUSION: 1.0000 mL/kg/h | INTRAVENOUS | Status: DC

## 2023-05-30 MED ORDER — HEPARIN SODIUM (PORCINE) 1000 UNIT/ML IJ SOLN
INTRAMUSCULAR | Status: AC
  Filled 2023-05-30: qty 10

## 2023-05-30 MED ORDER — IOHEXOL 350 MG/ML SOLN
INTRAVENOUS | Status: DC | PRN
  Administered 2023-05-30: 114 mL via INTRA_ARTERIAL

## 2023-05-30 MED ORDER — TICAGRELOR 90 MG PO TABS: 90.0000 mg | ORAL_TABLET | Freq: Two times a day (BID) | ORAL | Status: DC

## 2023-05-30 MED ORDER — SODIUM CHLORIDE 0.9% FLUSH: 3.0000 mL | INTRAVENOUS | Status: DC | PRN

## 2023-05-30 MED ORDER — SODIUM CHLORIDE 0.9 % IV SOLN: 250.0000 mL | INTRAVENOUS | Status: DC | PRN

## 2023-05-30 MED ORDER — ASPIRIN 81 MG PO CHEW: 81.0000 mg | CHEWABLE_TABLET | Freq: Every day | ORAL | Status: DC

## 2023-05-30 MED ORDER — NITROGLYCERIN 1 MG/10 ML FOR IR/CATH LAB
INTRA_ARTERIAL | Status: AC
  Filled 2023-05-30: qty 10

## 2023-05-30 SURGICAL SUPPLY — 23 items
BALLN EMERGE MR 2.0X15 (BALLOONS) ×1
BALLN ~~LOC~~ EMERGE MR 2.75X20 (BALLOONS) ×1
BALLOON EMERGE MR 2.0X15 (BALLOONS) IMPLANT
BALLOON ~~LOC~~ EMERGE MR 2.75X20 (BALLOONS) IMPLANT
CATH INFINITI AMBI 5FR TG (CATHETERS) IMPLANT
CATH VISTA GUIDE 6FR XB3.5 (CATHETERS) IMPLANT
CATH VISTA GUIDE 6FR XBLAD3.5 (CATHETERS) IMPLANT
GLIDESHEATH SLEND SS 6F .021 (SHEATH) IMPLANT
GUIDEWIRE INQWIRE 1.5J.035X260 (WIRE) IMPLANT
INQWIRE 1.5J .035X260CM (WIRE) ×1
KIT ENCORE 26 ADVANTAGE (KITS) IMPLANT
KIT HEART LEFT (KITS) ×1 IMPLANT
KIT SINGLE USE MANIFOLD (KITS) IMPLANT
KIT SYRINGE INJ CVI SPIKEX1 (MISCELLANEOUS) IMPLANT
PACK CARDIAC CATHETERIZATION (CUSTOM PROCEDURE TRAY) ×1 IMPLANT
SET ATX-X65L (MISCELLANEOUS) IMPLANT
SHEATH PROBE COVER 6X72 (BAG) IMPLANT
STENT SYNERGY XD 2.50X32 (Permanent Stent) IMPLANT
SYNERGY XD 2.50X32 (Permanent Stent) ×1 IMPLANT
TRANSDUCER W/STOPCOCK (MISCELLANEOUS) ×1 IMPLANT
TUBING CIL FLEX 10 FLL-RA (TUBING) ×1 IMPLANT
WIRE COUGAR XT STRL 190CM (WIRE) IMPLANT
WIRE PT2 MS 185 (WIRE) IMPLANT

## 2023-05-30 NOTE — TOC Benefit Eligibility Note (Signed)
Pharmacy Patient Advocate Encounter  Insurance verification completed.    The patient is insured through [employer] test claim for Brilinta 90mg  and the current 30 day co-pay is Product/service not covered - plan exclusion.  Ran test claim for Clopidogrel 75mg  and the current 30 day co-pay is $0   This test claim was processed through [employer]- copay amounts may vary at other pharmacies due to [employer], or as the patient moves through the different stages of their insurance plan.

## 2023-05-30 NOTE — Interval H&P Note (Signed)
Cath Lab Visit (complete for each Cath Lab visit)  Clinical Evaluation Leading to the Procedure:   ACS: No.  Non-ACS:    Anginal Classification: CCS III  Anti-ischemic medical therapy: Maximal Therapy (2 or more classes of medications)  Non-Invasive Test Results: Intermediate-risk stress test findings: cardiac mortality 1-3%/year  Prior CABG: No previous CABG      History and Physical Interval Note:  05/30/2023 11:19 AM  Brian Preston  has presented today for surgery, with the diagnosis of unstable angina.  The various methods of treatment have been discussed with the patient and family. After consideration of risks, benefits and other options for treatment, the patient has consented to  Procedure(s): LEFT HEART CATH AND CORONARY ANGIOGRAPHY (N/A) as a surgical intervention.  The patient's history has been reviewed, patient examined, no change in status, stable for surgery.  I have reviewed the patient's chart and labs.  Questions were answered to the patient's satisfaction.     Nicki Guadalajara

## 2023-05-30 NOTE — Progress Notes (Signed)
Lillia Abed, NP in and ok to d/c home

## 2023-05-30 NOTE — Discharge Summary (Signed)
Discharge Summary for Same Day PCI   Patient ID: Brian Preston MRN: [id number]; DOB: [birth year]  Admit date: 05/30/2023 Discharge date: 05/30/2023  Primary Care Provider: Erick Alley, DO  Primary Cardiologist: Donato Schultz, MD  Primary Electrophysiologist:  None   Discharge Diagnoses    Active Problems:   CAD (coronary artery disease)  Diagnostic Studies/Procedures    Cardiac Catheterization 05/30/2023:    Prox Cx lesion is 20% stenosed.   Mid Cx lesion is 90% stenosed.   Mid Cx to Dist Cx lesion is 70% stenosed.   Prox LAD to Mid LAD lesion is 50% stenosed.   A drug-eluting stent was successfully placed.   Post intervention, there is a 0% residual stenosis.   Post intervention, there is a 0% residual stenosis.   Post intervention, there is a 0% residual stenosis.   Recommend uninterrupted dual antiplatelet therapy with Aspirin 81mg  daily and Ticagrelor 90mg  twice daily.   Two-vessel CAD with with 50% proximal LAD stenosis and segmental stenosis of 90 and 70% in the circumflex/OM2 vessel.   Normal small ramus immediate vessel and large dominant RCA.   LVEDP 16 mmHg.   Successful PCI to the circumflex/OM2 vessel with ultimate insertion of a 2.5 x 32 mm Synergy DES stent postdilated to 2.64 mm with the stenoses being reduced to 0%.   RECOMMENDATION: DAPT with aspirin/Brilinta for minimum of 12 months.  Medical therapy for concomitant LAD disease and blood pressure control.  Will titrate lipid-lowering therapy to rosuvastatin 40 mg for more aggressive lipid management with target LDL less than 55.  Plan for same-day discharge this evening with follow up with Tereso Newcomer, PA-C and Dr. Anne Fu.   Diagnostic Dominance: Right  Intervention   _____________   History of Present Illness     Brian Preston is a 52 y.o. male with CAD noted on coronary CTA, HTN, and HLD who presented to the office 7/16 for follow-up.  Last seen by Dr. Ronita Hipps 12/2022 and is on a stable antianginal  regimen.  Plan was to proceed with cardiac catheterization if he had recurrent worsening chest pain.  At this office visit on 7/16 he reported having some anginal episodes.  Given this finding and limitations for further titration of his antianginal medications he was set up for outpatient cardiac catheterization.  Hospital Course     The patient underwent cardiac cath as noted above with successful PCI/DES x1 to left circumflex/OM 2. Plan for DAPT with ASA/Brilinta for at least 6 months. The patient was seen by cardiac rehab while in short stay. There were no observed complications post cath. Radial cath site was re-evaluated prior to discharge and found to be stable without any complications. Instructions/precautions regarding cath site care were given prior to discharge.  Payam Gribble was seen by Dr. Tresa Endo and determined stable for discharge home. Follow up with our office has been arranged. Medications are listed below. Pertinent changes include Brilinta, increase Crestor to 40 mg daily.  _____________  Cath/PCI Registry Performance & Quality Measures: Aspirin prescribed? - Yes ADP Receptor Inhibitor (Plavix/Clopidogrel, Brilinta/Ticagrelor or Effient/Prasugrel) prescribed (includes medically managed patients)? - Yes High Intensity Statin (Lipitor 40-80mg  or Crestor 20-40mg ) prescribed? - Yes For EF <40%, was ACEI/ARB prescribed? - Not Applicable (EF >/= 40%) For EF <40%, Aldosterone Antagonist (Spironolactone or Eplerenone) prescribed? - Not Applicable (EF >/= 40%) Cardiac Rehab Phase II ordered (Included Medically managed Patients)? - Yes  _____________   Discharge Vitals Blood pressure 107/75, pulse 70, temperature 97.9 F (36.6 C), temperature  source Oral, resp. rate 16, height 5\' 4"  (1.626 m), weight 61.2 kg, SpO2 99%.  Filed Weights   05/30/23 0834  Weight: 61.2 kg    Last Labs & Radiologic Studies    CBC Recent Labs    05/29/23 0916  WBC 8.1  HGB 15.8  HCT 48.3  MCV 86   PLT 308   Basic Metabolic Panel Recent Labs    16/10/96 0916  NA 139  K 3.9  CL 100  CO2 27  GLUCOSE 82  BUN 16  CREATININE 0.89  CALCIUM 9.8   Liver Function Tests No results for input(s): "AST", "ALT", "ALKPHOS", "BILITOT", "PROT", "ALBUMIN" in the last 72 hours. No results for input(s): "LIPASE", "AMYLASE" in the last 72 hours. High Sensitivity Troponin:   No results for input(s): "TROPONINIHS" in the last 720 hours.  BNP Invalid input(s): "POCBNP" D-Dimer No results for input(s): "DDIMER" in the last 72 hours. Hemoglobin A1C No results for input(s): "HGBA1C" in the last 72 hours. Fasting Lipid Panel No results for input(s): "CHOL", "HDL", "LDLCALC", "TRIG", "CHOLHDL", "LDLDIRECT" in the last 72 hours. Thyroid Function Tests No results for input(s): "TSH", "T4TOTAL", "T3FREE", "THYROIDAB" in the last 72 hours.  Invalid input(s): "FREET3" _____________  CARDIAC CATHETERIZATION  Result Date: 05/30/2023   Prox Cx lesion is 20% stenosed.   Mid Cx lesion is 90% stenosed.   Mid Cx to Dist Cx lesion is 70% stenosed.   Prox LAD to Mid LAD lesion is 50% stenosed.   A drug-eluting stent was successfully placed.   Post intervention, there is a 0% residual stenosis.   Post intervention, there is a 0% residual stenosis.   Post intervention, there is a 0% residual stenosis.   Recommend uninterrupted dual antiplatelet therapy with Aspirin 81mg  daily and Ticagrelor 90mg  twice daily. Two-vessel CAD with with 50% proximal LAD stenosis and segmental stenosis of 90 and 70% in the circumflex/OM2 vessel. Normal small ramus immediate vessel and large dominant RCA. LVEDP 16 mmHg. Successful PCI to the circumflex/OM2 vessel with ultimate insertion of a 2.5 x 32 mm Synergy DES stent postdilated to 2.64 mm with the stenoses being reduced to 0%. RECOMMENDATION: DAPT with aspirin/Brilinta for minimum of 12 months.  Medical therapy for concomitant LAD disease and blood pressure control.  Will titrate  lipid-lowering therapy to rosuvastatin 40 mg for more aggressive lipid management with target LDL less than 55.  Plan for same-day discharge this evening with follow up with Tereso Newcomer, PA-C and Dr. Anne Fu.    Disposition   Pt is being discharged home today in good condition.  Follow-up Plans & Appointments     Discharge Instructions     Amb Referral to Cardiac Rehabilitation   Complete by: As directed    Diagnosis: Coronary Stents   After initial evaluation and assessments completed: Virtual Based Care may be provided alone or in conjunction with Phase 2 Cardiac Rehab based on patient barriers.: Yes   Intensive Cardiac Rehabilitation (ICR) [facility] location only OR Traditional Cardiac Rehabilitation (TCR) *If criteria for ICR are not met will enroll in TCR [facility] only): Yes   ticagrelor (BRILINTA) tablet   Complete by: As directed         Discharge Medications   Allergies as of 05/30/2023   No Known Allergies      Medication List     TAKE these medications    aspirin EC 81 MG tablet Take 1 tablet (81 mg total) by mouth daily. Swallow whole.   Brilinta 90 MG  Tabs tablet Generic drug: ticagrelor Take 1 tablet (90 mg total) by mouth 2 (two) times daily. Start taking on: May 31, 2023   isosorbide mononitrate 30 MG 24 hr tablet Commonly known as: IMDUR Take 15 mg by mouth daily.   lisinopril-hydrochlorothiazide 10-12.5 MG tablet Commonly known as: ZESTORETIC Take 1 tablet by mouth daily.   metoprolol succinate 25 MG 24 hr tablet Commonly known as: Toprol XL Take 0.5 tablets (12.5 mg total) by mouth daily.   nitroGLYCERIN 0.4 MG SL tablet Commonly known as: NITROSTAT Place 1 tablet (0.4 mg total) under the tongue every 5 (five) minutes as needed for chest pain.   pantoprazole 40 MG tablet Commonly known as: PROTONIX Take 1 tablet (40 mg total) by mouth 2 (two) times daily.   rosuvastatin 40 MG tablet Commonly known as: CRESTOR Take 1 tablet (40 mg total) by  mouth daily. What changed:  medication strength how much to take         Allergies No Known Allergies  Outstanding Labs/Studies   FLP/LFTs in 8 weeks   Duration of Discharge Encounter   Greater than 30 minutes including physician time.  Signed, Laverda Page, NP 05/30/2023, 4:49 PM

## 2023-05-30 NOTE — Progress Notes (Signed)
Pt was educated on stent card, stent location, Antiplatelet and ASA use, wt restrictions, no baths/daily wash-ups, s/s of infection, ex guidelines, s/s to stop exercising, NTG use and calling 911, heart healthy diet, risk factors and CRPII. Pt received materials on exercise, diet, and CRPII. Will refer to [facility].

## 2023-05-30 NOTE — Progress Notes (Signed)
Assumed care of pt at this time.

## 2023-05-31 ENCOUNTER — Encounter (HOSPITAL_COMMUNITY): Payer: Self-pay | Admitting: Cardiovascular Disease

## 2023-06-01 ENCOUNTER — Telehealth: Payer: Self-pay | Admitting: Pharmacy Technician

## 2023-06-01 ENCOUNTER — Other Ambulatory Visit (HOSPITAL_COMMUNITY): Payer: Self-pay

## 2023-06-01 NOTE — Telephone Encounter (Signed)
Pharmacy Patient Advocate Encounter  Received notification from [facility] that Prior Authorization for PANTOPRAZOLE 40MG  has been APPROVED from UNSURE to UNSURE.Marland Kitchen  PA #/Case ID/Reference #: [id number]  Spoke with Pharmacy to process. Copay is $0   Received notification from CoverMyMeds that prior authorization for PANTOPRAZOLE 40MG  is required/requested.   Insurance verification completed.   The patient is insured through [facility] .    Per test claim: PA submitted to [facility] via CoverMyMeds Key/confirmation #/EOC [facility] Status is pending

## 2023-06-05 NOTE — Telephone Encounter (Signed)
Patient Advocate Encounter  Effective dates received on approval letter.   Effective: 06-01-2023 to 05-31-2024

## 2023-06-08 ENCOUNTER — Telehealth: Payer: Self-pay | Admitting: Cardiology

## 2023-06-08 NOTE — Telephone Encounter (Signed)
Returned patient's Social research officer, government # [id number]. No answer, Left message asking patient to call our office to discuss his symptoms.

## 2023-06-08 NOTE — Telephone Encounter (Signed)
Pt c/o of Chest Pain: STAT if CP now or developed within 24 hours  1. Are you having CP right now? No  2. Are you experiencing any other symptoms (ex. SOB, nausea, vomiting, sweating)? Dizziness  3. How long have you been experiencing CP? A week  4. Is your CP continuous or coming and going? Not sure   5. Have you taken Nitroglycerin? No  ?

## 2023-06-12 ENCOUNTER — Other Ambulatory Visit: Payer: Self-pay

## 2023-06-12 ENCOUNTER — Ambulatory Visit: Payer: 59 | Admitting: Internal Medicine

## 2023-06-12 ENCOUNTER — Encounter: Payer: Self-pay | Admitting: Internal Medicine

## 2023-06-12 VITALS — BP 116/78 | HR 71 | Resp 16 | Ht 64.0 in | Wt 137.0 lb

## 2023-06-12 DIAGNOSIS — B181 Chronic viral hepatitis B without delta-agent: Secondary | ICD-10-CM

## 2023-06-12 DIAGNOSIS — K7460 Unspecified cirrhosis of liver: Secondary | ICD-10-CM | POA: Diagnosis not present

## 2023-06-12 DIAGNOSIS — B1910 Unspecified viral hepatitis B without hepatic coma: Secondary | ICD-10-CM

## 2023-06-12 DIAGNOSIS — Z9189 Other specified personal risk factors, not elsewhere classified: Secondary | ICD-10-CM

## 2023-06-12 NOTE — Assessment & Plan Note (Signed)
He has now had several sequential labs c/w resolved hepatitis B with a negative surface Ag and negative DNA.  I will again repeat today but at this point, if it remains negative, no indication to recheck. I will check his surface Ab as well.

## 2023-06-12 NOTE — Progress Notes (Unsigned)
Cardiology Office Note:  .   Date:  06/13/2023  ID:  [name], DOB 05-18-1971, MRN [id number] PCP: Erick Alley, DO  [facility] Providers Cardiologist:  Donato Schultz, MD { History of Present Illness: .   Brian Preston is a 52 y.o. male who returns for follow-up of CAD.  He was seen by Dr. Anne Fu 03/05/2023.  Had stable antianginal regimen.  Plan was to proceed with cardiac catheterization if he had recurring or worsening pain.  He was seen with use of an interpreter.  Over the past month he noted fairly consistent substernal chest discomfort.  Had not really noticed an significant worsening with activity.  Had not had much relief.  Did not have any nitroglycerin.  He not had any radiating symptoms, associated with nausea or diaphoresis.  He has not had any pleuritic symptoms.  He has not had symptoms with meals.  He noted occasional shortness of breath.  He also noted shortness of breath with exertion.  He not had any orthopnea, syncope, lower extremity edema.  Plan was to proceed with cardiac catheterization.  Ultimately, circumflex stent was placed.  Full cardiac catheterization below.  Today, he tells me that he had some chest pressure after his procedure.  Still somewhat there.  Nothing makes it better or worse.  Unfortunately, his mother passed away.  He has been dealing with a lot of grief lately.  His blood pressure was running low so his lisinopril was discontinued, however, last night he slept over his father's house and did not have his blood pressure cuff.  He went ahead and took a lisinopril/HCTZ before bed.  That is why his blood pressure is 89/62 today.  Feeling a little lightheaded and dizzy.  Also, would like to transition from Brilinta to Plavix due to cost reasons.  We have given him instructions for discontinuing Brilinta, Plavix load, and daily 75 mg Plavix.  He is interested in cardiac rehab and a referral was placed in the hospital.  Reports no shortness of breath nor  dyspnea on exertion. No edema, orthopnea, PND. Reports no palpitations.   ROS: ROS in HPI  Studies Reviewed: .       Cardiac catheterization 05/30/2023 Left Anterior Descending  Prox LAD to Mid LAD lesion is 50% stenosed.    First Diagonal Branch  Vessel is small in size.    Left Circumflex  Prox Cx lesion is 20% stenosed.  Mid Cx lesion is 90% stenosed.  Mid Cx to Dist Cx lesion is 70% stenosed.    First Obtuse Marginal Branch    Intervention   Prox Cx lesion  Stent (Also treats lesions: Mid Cx, and Mid Cx to Dist Cx)  Pre-stent angioplasty was performed. A drug-eluting stent was successfully placed. Stent strut is well apposed. Post-stent angioplasty was performed.  Post-Intervention Lesion Assessment  The intervention was successful. Pre-interventional TIMI flow is 3. Post-intervention TIMI flow is 3.  There is a 0% residual stenosis post intervention.    Mid Cx lesion  Stent (Also treats lesions: Prox Cx, and Mid Cx to Dist Cx)  See details in Prox Cx lesion.  Post-Intervention Lesion Assessment  The intervention was successful. Pre-interventional TIMI flow is 3. Post-intervention TIMI flow is 3. No complications occurred at this lesion.  There is a 0% residual stenosis post intervention.    Mid Cx to Dist Cx lesion  Stent (Also treats lesions: Prox Cx, and Mid Cx)  See details in Prox Cx lesion.  Post-Intervention Lesion Assessment  The intervention was successful. Pre-interventional TIMI flow is 3. Post-intervention TIMI flow is 3. No complications occurred at this lesion.  There is a 0% residual stenosis post intervention.     Left Heart  Left Ventricle LVEDP 16 mm Hg.   Coronary Diagrams  Diagnostic Dominance: Right  Intervention         Physical Exam:   VS:  BP (!) 89/62   Pulse 70   Ht 5\' 4"  (1.626 m)   Wt 136 lb 6.4 oz (61.9 kg)   SpO2 98%   BMI 23.41 kg/m    Wt Readings from Last 3 Encounters:  06/13/23 136 lb 6.4 oz (61.9 kg)  06/12/23  137 lb (62.1 kg)  05/30/23 135 lb (61.2 kg)    GEN: Well nourished, well developed in no acute distress NECK: No JVD; No carotid bruits CARDIAC: RRR, no murmurs, rubs, gallops RESPIRATORY:  Clear to auscultation without rales, wheezing or rhonchi  ABDOMEN: Soft, non-tender, non-distended EXTREMITIES:  No edema; No deformity   ASSESSMENT AND PLAN: .   1.  CAD status post circumflex stent -Some mild chest tightness, nonexertional -Unfortunately, we cannot increase his Imdur today.  Blood pressure -Continue current medications including aspirin 81 mg daily, Imdur 15 mg daily, stop lisinopril/HCTZ, metoprolol succinate 12.5 mg daily, Crestor 80 mg to take at night, and transitioning Brilinta to Plavix.  2.  Hypertension -Hypotension today since he took the lisinopril last night -We have asked him to track his blood pressure for the next 2 weeks an hour after morning medications and send [place] those values -Provided information today on heart healthy, low-sodium diet  3.  Hyperlipidemia -Continue Crestor 40 mg daily -Most recent lipid panel was at goal -He will need another lipid panel and LFTs April 2025    Cardiac Rehabilitation Eligibility Assessment  The patient is ready to start cardiac rehabilitation from a cardiac standpoint.      Dispo: He can follow-up in 3 months with Dr. Anne Fu or APP  Signed, Sharlene Dory, PA-C

## 2023-06-12 NOTE — Assessment & Plan Note (Signed)
Cirrhosis reported on MRI from 2021 but ultrasounds good and no concerns.  Also his platelets are well in the normal range too suggesting he does not have active cirrhosis.  I will check  his ultrasound with elastography to help determine. If not high probability, he will not need further cirrhosis management.

## 2023-06-12 NOTE — Assessment & Plan Note (Signed)
At this point, if his active hepatitis B is confirmed again to be negative, there is no indication to continue with [facility] screening and he can follow up as needd.

## 2023-06-12 NOTE — Progress Notes (Signed)
   Subjective:    Patient ID: Brian Preston, male    DOB: 01/25/1971, 52 y.o.   MRN: [id number]  HPI [place] is here for follow up of chronic, inactive hepatitis B and cirrhosis.   He recently had a cardiac stent placed and doing well.  His hepatitis B surface Ag was again negative last visit and viral DNA negative.  He has a history of reported cirrhosis on his MRI ('early cirrhosis') and follow up ultrasounds without cirrhosis.  Recently with an EGD and no varices reported.  Platelets 308.     Review of Systems  Constitutional:  Negative for fatigue.  Gastrointestinal:  Negative for diarrhea.       Objective:   Physical Exam Eyes:     General: No scleral icterus. Pulmonary:     Effort: Pulmonary effort is normal.  Neurological:     Mental Status: He is alert.   SH: no alcohol        Assessment & Plan:

## 2023-06-13 ENCOUNTER — Ambulatory Visit: Payer: 59 | Attending: Physician Assistant | Admitting: Physician Assistant

## 2023-06-13 ENCOUNTER — Encounter: Payer: Self-pay | Admitting: Physician Assistant

## 2023-06-13 VITALS — BP 89/62 | HR 70 | Ht 64.0 in | Wt 136.4 lb

## 2023-06-13 DIAGNOSIS — I2510 Atherosclerotic heart disease of native coronary artery without angina pectoris: Secondary | ICD-10-CM

## 2023-06-13 DIAGNOSIS — Z955 Presence of coronary angioplasty implant and graft: Secondary | ICD-10-CM | POA: Diagnosis not present

## 2023-06-13 DIAGNOSIS — E785 Hyperlipidemia, unspecified: Secondary | ICD-10-CM | POA: Diagnosis not present

## 2023-06-13 DIAGNOSIS — I10 Essential (primary) hypertension: Secondary | ICD-10-CM | POA: Diagnosis not present

## 2023-06-13 MED ORDER — CLOPIDOGREL BISULFATE 75 MG PO TABS: ORAL_TABLET | ORAL | 3 refills | Status: DC

## 2023-06-13 NOTE — Patient Instructions (Addendum)
Medication Instructions:  Your physician has recommended you make the following change in your medication:   STOP Brilinta  START Plavix 12 HOURS AFTER YOUR LAST DOSE OF BRILINTA.  YOU WILL TAKE 6 TABLETS OF PLAVIX ON THE 1ST DOSE THEN YOU WILL GO DOWN TO 1 TABLET BY MOUTH EVERYDAY AFTER  YOU MAY TAKE YOUR CRESTOR BEFORE BED   *If you need a refill on your cardiac medications before your next appointment, please call your pharmacy*   Lab Work: None ordered  If you have labs (blood work) drawn today and your tests are completely normal, you will receive your results only by: MyChart Message (if you have MyChart) OR A paper copy in the mail If you have any lab test that is abnormal or we need to change your treatment, we will call you to review the results.   Testing/Procedures: None ordered   Follow-Up: At [facility], you and your health needs are our priority.  As part of our continuing mission to provide you with exceptional heart care, we have created designated Provider Care Teams.  These Care Teams include your primary Cardiologist (physician) and Advanced Practice Providers (APPs -  Physician Assistants and Nurse Practitioners) who all work together to provide you with the care you need, when you need it.  We recommend signing up for the patient portal called "MyChart".  Sign up information is provided on this After Visit Summary.  MyChart is used to connect with patients for Virtual Visits (Telemedicine).  Patients are able to view lab/test results, encounter notes, upcoming appointments, etc.  Non-urgent messages can be sent to your provider as well.   To learn more about what you can do with MyChart, go to [email or web address].    Your next appointment:   3 month(s)  Provider:   Tereso Newcomer, PA-C         Other Instructions  Your physician has requested that you regularly monitor and record your blood pressure readings at home. Please use the same machine at  the same time of day to check your readings and record them to bring to your follow-up visit.   Please monitor blood pressures and keep a log of your readings for 2 weeks then send in the readings via mychart or call them into the office.    Make sure to check 2 hours after your medications.    AVOID these things for 30 minutes before checking your blood pressure: No Drinking caffeine. No Drinking alcohol. No Eating. No Smoking. No Exercising.   Five minutes before checking your blood pressure: Pee. Sit in a dining chair. Avoid sitting in a soft couch or armchair. Be quiet. Do not talk   Ch? ?? ?n u?ng i?t natri Low-Sodium Eating Plan Mu?i (natri) gip duy tr cn b?ng d?ch c l?i cho s?c kh?e trong c? th?. Qu nhi?u natri c th? lm t?ng huy?t p qu v?. Qu nhi?u natri c?ng c th? khi?n ? ??ng d?ch v ch?t th?i trong c? th? qu v?. Chuyn gia ch?m [place] s?c kh?e ho?c chuyn gia dinh d??ng c?a qu v? c th? khuy?n ngh? m?t ch??ng trnh ?n u?ng t natri n?u qu v? b? cao huy?t p (t?ng huy?t p), b?nh th?n, b?nh gan, ho?c suy tim. ?n i?t natri h?n c th? gip gi?m huy?t p v gi?m s?ng n?Marland Kitchen Vi?c ny c?ng c th? b?o v? tim, gan v th?n c?a qu v?. C nh?ng l?i khuyn no ?? tun th? k? ho?ch ny? ??c nhn  th?c ph?m  Ki?m tra nhn th?c ph?m ?? xem hm l??ng natri cho m?i kh?u ph?n. N?u Husein? vi? ?n qu m?t kh?u ph?n, [place]? vi? ph?i nhn l??ng natri c trong danh sch v??i s? kh?u ph?n. Ch?n th?c ph?m c l??ng natri d??i 140 miligam (mg) trong m?i kh?u ph?n. Trnh nh?ng lo?i th?c ph?m c t? 300 mg natri tr? ln trong m?i kh?u ph?n. Lun ki?m tra l??ng natri trong s?n ph?m, ngay c? khi nhn ghi l "khng mu?i" ho?c "khng thm mu?i". Mua s?m  Mua cc s?n ph?m dn nhn "natri th?p" ho?c "khng thm mu?i." Mua th?c ph?m t??i. Trnh nh?ng th?c ph?m ?ng h?p v cc mn ?n ch? bi?n s?n ho?c ?ng l?nh. Trnh cc lo?i th?t ?ng h?p, ? x? l, ho?c ch? bi?n s?n. Mua bnh m c d??i 80 mg natri  trong m?i lt. N?u n??ng  ?n nhi?u th?c ?n n?u t?i nh h?n. C? g?ng gi?m b?t vi?c ?n ? nh hng, buffet v th?c ?n nhanh. C? g?ng khng thm mu?i khi n?u. S? d?ng gia v? khng co? mu?i ho?c th?o d??c thay v mu?i ?n ho?c mu?i bi?n. Ki?m tra v?i chuyn gia ch?m [place] s?c kh?e ho??c d???c sy? c?a Zyad? vi? tr??c khi s? d?ng cc s?n ph?m thay th? mu?i. N?u b?ng d?u c ngu?n g?c th?c v?t, ch?ng h?n nh? d?u h?t c?i d?u, d?u h??ng d??ng, ho?c d?u  liu. Ln k? ho?ch cho b?a ?n Khi ?n ? nh hng, hy yu c?u th??c ?n c?a qu v? ???c n?u nha?t ho?c khng c mu?i. Trnh cc mn ?n ghi nhn l ??p mu?i, ngm, ??p ho?c hun khi. Trnh cc mn ?n lm b?ng n??c t??ng, miso, ho?c n??c s?t teriyaki. Trnh cc lo?i th?c ph?m c monosodium glutamate (MSG). MSG c th? ???c thm vo m?t s? th?c ph?m c?a nh hng, n??c s?t, sp, [place] v th?c ph?m ?ng h?p. Hy chu?n b? nh?ng mn ?n c th? n??ng, b? l, lu?c, rang ho?c h?p. Cc mn ny th??ng ???c lm v?i t natri h?n. Thng tin chung C? g?ng h?n ch? l??ng natri qu v? dng ? m?c 1.500-2.300 mg m?i ngy, ho?c l??ng natri theo ch? d?n c?a chuyn gia ch?m [place] s?c kh?e. Ti nn ?n nh?ng lo?i th?c ph?m no? Tri cy Tra?i cy t??i, ?ng l?nh, ho?c ?ng h?p. N??c p tri cy. Derrek Gu c? Rau ?ng la?nh ho??c rau t??i. Rau ?ng h?p "khng thm mu?i". H?n h?p c chua nho v x?t c chua "khng thm mu?i". N???c p c chua v n??c rau p i?t natri ho??c gi?m natri. Cc lo?i h?t Ng? c?c i?t natri, nh? y?n m?ch, ha?t la m v g?o n? b?ng v la m ???c nghi?n nh?. Bnh Marvelous rn co? i?t natri. C?m khng mu?i. M? ?ng khng mu?i. Bnh m i?t natri. Bnh m nguyn h?t v m? ?ng nguyn h?t. Th?t v cc protein khc Thi?t, th?t gia c?m, h?i s?n v c t??i ho?c ?ng l?nh. Nh?ng s?n ph?m ny khng nn thm mu?i. C ng? v c h?i ?o?ng h?p i?t natri. Cc lo?i h?t khng ??p mu?i. ??u H-Lan, ??u v ??u l?ng kh khng thm mu?i. ??u ?ng h?p khng ??p mu?i. Tr?ng. B? h?t khng  mu?i. S?a S?a. S?a ??u nnh. Pho mt c l??ng natri th?p t? nhin, ch?ng h?n nh? pho mt ricotta, mozzarella t??i, ho?c pho mt Th?y S?. Pho ma?t i?t ho??c gia?m natri. Pho mt kem. S??a chua. Milagros Loll v? Th?o d??c va? gia  vi? t??i v s?y kh. Gia vi? khng co? mu?i. N??t x?t c chua v m t?t c t natri. N??c tr?n sa-lt khng c natri. N??c x?t mayonnaise nh? khng c natri. Cy ca?i ng??a t??i ho??c ???p la?nh. N??c chanh. Gi?m. Cc th?c ph?m khc Sp t? lm, t natri, ho?c b?t natri. B?ng ng va? ba?nh Peter khng mu?i. Khoai ty chin t mu?i ho?c khng c mu?i. Cc mn ???c li?t k ? trn c th? khng ph?i l t?t c? cc lo?i th?c ph?m v ?? u?ng qu v? c th? ?n u?ng. Ni chuy?n v?i chuyn gia dinh d??ng ?? tm hi?u thm. Ti nn trnh nh?ng th?c ph?m no? Rau c? D?a c?i b?p, rau ngm gi?m v gia v?.  liu. Khoai ty chin. Hnh ty t?m b?t chin gin. Derrek Gu c? ?o?ng h?p thng th??ng, ngo?i tr? cc lo?i t natri ho??c gia?m natri. N??c s?t c chua va? pate ?ng h?p thng th???ng. N???c c chua v n??c p rau c? thng th???ng. Rau ?ng l?nh tr?n n??c s?t. Cc lo?i h?t Ng? c?c nng ?n li?n. Bnh m nh?i, bnh v [place] ca?c loa?i. Bnh m n??ng. C?m ho?c m ?ng h?n h?p co? gia vi?Marland Kitchen C?c m ?n li?n. Mi? ?ng va? pho ma?t ?ng h?p ho?c ?ng l?nh. Ba?nh m??n thng th???ng. B?t tr?n s??n. Th?t v cc protein khc Thi?t ho?c c ???p mu?i, ?ng h?p, hun khi, t?m gia v?, ho??c d?m chua. Th?t n?u s?n ho?c ??p mu?i, ch?ng h?n nh? xc xch ho?c bnh m th?t. Th?t l?n mu?i xng khi. Gi?m bng. Xc xch Pepperoni. Xc xch nng. Th?t b mu?i. Th?t b mu?i thi lt m?ng. Th?t l?n mu?i. Thi?t bo? kh. C trch ngm chua, c c?m v c mi. C ng? ?ng h?p thng th??ng. Ca?c loa?i ha?t ???p mu?i. S?a Pho mt ch? bi?n s?n v pho mt ph?t. Pho mt c?ng. S?a ?ng pho mt. Ph mt c s?i m?c xanh. Ph mt Feta. Ph mt s?i. Ph mt s?a g?n kem thng th??ng. S?a b?. S?a ?ng h?p. M? v d?u B? m?n. B? th?c v?t  thng th??ng. B? s??a tru. M?? thi?t xng kho?i. Gia v? Mu?i hnh, mu?i t?i, mu?i nm, mu?i ?n v mu?i bi?n. N???c thi?t ?ng h?p v ?ng gi. N???c x?t [place]. N???c x?t tartar. N??c x?t th?t quay. N???c x?t Teriyaki. N??c t??ng, g?m c? loa?i gi?m natri. N??c x?t th?t n??ng. N??c m?m. D?u ho. N??c x?t cocktail. Cy c?i ng?a Chee? vi? th?y trn k? ha?ng. N??c x?t c chua v m t?t thng th???ng. H??ng li?u th?t v ch?t la?m m?m thi?t. Tho?i b?t canh. T??ng ?t. N??c x?t marinat ch? bi?n s?n ho?c ?ng gi. Gia v? taco ch? bi?n s?n ho?c ?ng gi. ?? gia v?. N???c tr?n sa la?t thng th???ng. Salsa. Cc th?c ph?m khc B?ng ng v bnh Briyan m??n. Bim bim va? ba?nh ng. Khoai ty v bnh ng. Sp ?o?ng h?p ho??c kh. Pizza. Mn khai v? ?ng la?nh v bnh che?n. Cc mn ???c li?t k ? trn c th? khng ph?i l t?t c? cc lo?i th?c ph?m v ?? u?ng qu v? c?n trnh. Ni chuy?n v?i chuyn gia dinh d??ng ?? tm hi?u thm. Thng tin ny khng nh?m m?c ?ch thay th? cho l?i khuyn m chuyn gia ch?m [place] s?c kh?e ni v?i qu v?. Hy b?o ??m qu v? ph?i th?o lu?n b?t k? v?n ?? g m qu v? c v?i chuyn gia ch?m [place] s?c kh?e c?a qu  v?. Document Revised: 12/08/2022 Document Reviewed: 12/08/2022 Elsevier Patient Education  2024 [employer] Inc.  K? ho?ch ?n u?ng t?t cho tim Heart-Healthy Eating Plan Nhi?u y?u t? ?nh h??ng ??n s?c kh?e c?a tim qu v?, bao g?m thi quen ?n u?ng v t?p luy?n. S?c kh?e tim m?ch cn ???c g?i l s?c kh?e m?ch vnh. Nguy c? b? b?nh m?ch vnh t?ng ln cng v?i n?ng ?? ch?t bo (lipid) b?t th??ng trong mu. K? ho?ch ?n c l?i cho tim bao g?m gi?i h?n cc ch?t bo khng t?t cho s?c kh?e, t?ng cc ch?t bo t?t cho s?c kh?e, h?n ch? mu?i (natri) v th?c hi?n cc thay ??i khc v? ch? ?? ?n v l?i s?ng. K? ho?ch c?a ti l g? Chuyn gia ch?m [place] s?c kh?e c th? khuy?n ngh?: Qu v? h?n ch? l??ng ch?t bo tiu th? ?? m??c t? _________% t?ng l??ng calo m?i ngy tr? xu?ng. Qu v? h?n  ch? l??ng ch?t bo bo ha ?? m??c t? _________% t?ng l??ng calo m?i ngy tr? xu?ng. Qu v? h?n ch? l??ng cholesterol trong ch? ?? ?n u?ng c?a [place]arelle? vi? ?? m??c d??i _________ mg m?i ngy. Qu v? h?n ch? l??ng natri trong ch? ?? ?n u?ng c?a Deo? vi? ?? m??c d??i _________ mg m?i ngy. C nh?ng l?i khuyn no ?? tun th? k? ho?ch ny? N?u N?u ?? ?n b?ng cc ph??ng php khc thay vi? chin/rn. Bo? lo?, lu?c, n??ng v hun, t?t c? ??u l nh??ng l?a ch?n t?t. Nh?ng cch khc ?? gi?m ch?t bo bao g?m: Lo?i b? da kh?i th?t da c?m. Lo?i b? t?t c? ph?n m? nhn th?y ra kh?i th?t. H?p rau c? b?ng n??c ho?c n??c dng. Ln k? ho?ch cho b?a ?n  Trong b?a ?n, hy hnh dung vi?c chia ??a th?c ?n ra thnh b?n ph?n: Cho rau v sa lt rau xanh va?o m?t n?a ??a ??ng th?c ?n. Cho ngu? c?c nguyn ca?m va?o m?t ph?n t? ??a. Cho th?c ?n c protein thi?t na?c va?o m?t ph?n t? ??a. ?n 2-4 c?c rau c? m?i ngy. M?t c?c rau c? t??ng ???ng v?i 1 c?c (91 g) bng c?i xanh hay hoa sp l?, 2 c? c r?t c? v?a, 1 ?t chung c? l?n, 1 c? khoai lang l?n, 1 qu? c chua l?n, 1 c? khoai ty tr?ng c? v?a, 2 c?c (150 g) rau l t??i s?ng. ?n 1-2 c?c tri cy m?i ngy. M?t c?c tri cy t??ng ???ng v?i 1 qu? to nh?, 1 qu? chu?i l?n, 1 c?c (237 g) tri cy tr?n, 1 qu? cam l?n,  c?c (82 g) tri cy kh, 1 c?c (240 mL) n??c p tri cy 100%. ?n nhi?u th?c ph?m c ch?t x? ha tan. V d? bao g?m to, bng c?i xanh, c r?t, ??u h?t, ??u H Lan v la m?ch. ???t mu?c tiu ?n 25-30 g ch?t x? m?i ngy. T?ng tiu th? qu? ??u, qu? h?ch v cc lo?i h?t ln 4-5 kh?u ph?n m?i tu?n. M?t kh?u ph?n ??u kh ho?c qu? ??u t??ng ???ng 1?4 c?c (90 g) n?u chn, 1 kh?u ph?n qu? h?ch l  oz (12 qu? h?nh nhn, 24 qu? h? tr?n ho?c 7 n?a qu? c ch) v 1 kh?u ph?n h?t t??ng ???ng  oz (8 g). Ch?t bo Ch?n cc ch?t bo t?t cho s?c kh?e th??ng xuyn h?n. Ch?n ch?t bo khng bo ha ??n v ch?t be?o khng ba?o ho?a ?a, ch?ng h?n nh? d?u  liu v d?u canola,  d?u  b?, h?t lanh, qu? c ch, h?nh nhn v cc lo?i h?t. ?n thm ch?t bo omega-3. Ch?n c h?i, c thu, c mi, c ng?, d?u h?t lanh va? h?t lanh nghi?n. ???t mu?c tiu ?n c t nh?t 2 l?n m?i tu?n. Ki?m tra nhn th?c ph?m c?n th?n ?? nh?n bi?t th?c ph?m c ch?t bo chuy?n ha ho?c c hm l??ng ch?t bo bo ha cao. H?n ch? cc ch?t bo bo ha. Nh?ng ch?t bo ny ???c tm th?y trong cc s?n ph?m ??ng v?t, nh? th?t, b? v kem. Ca?c ngu?n ch?t bo bo ha t?? th??c v?t bao g?m d?u c?, d?u h?t c? v d?u d?a. [place] cc th?c ph?m co? cc lo?i d?u hydro ha m?t ph?n. Nh?ng th?c ph?m ny c ch?t bo chuy?n ha. V d? bao g?m b? th?c v?t, m?t s? b? th?c v?t ?o?ng h?p, bnh [place], bnh [place] gin v cc lo?i bnh n??ng khc. Trnh cc th?c ph?m chin/rn. Thng tin chung ?n nhi?u th?c ?n n?u t?i nh h?n v ?n i?t th??c ?n ?? nh hng, th??c ?n t? ch?n v th?c ?n nhanh h?n. H?n ch? ho?c trnh u?ng r??u. H?n ch? cc th?c ph?m c nhi?u ???ng b? sung v tinh b?t ??n gi?n nh? th?c ph?m ???c lm b?ng b?t m tr?ng tinh ch? (bnh m tr?ng, bnh ng?t, ?? ng?t). Gi?m cn n?u qu v? th?a cn. Gia?m ch? 5-10% s? cn n?ng c? th? c th? gip ch cho s?c kh?e t?ng th? c?a [place]? vi? v ng?n ng?a cc b?nh nh? ti?u ???ng v b?nh tim. Theo di l??ng natri tiu th? c?a qu v?, ??c bi?t l n?u qu v? b? cao huy?t p. Trao ??i v?i chuyn gia ch?m [place] s?c kh?e v? m?c tiu th? natri c?a qu v?. C? g?ng k?t h?p nhi?u b?a ?n chay h?n m?i tu?n. Ti nn ?n nh?ng lo?i th?c ph?m no? Tri cy T?t c? cc lo?i tra?i cy t??i, ?ng h?p (d???i da?ng n??c p t? nhin) ho?c ?ng l?nh. Derrek Gu c? Derrek Gu c? t??i ho?c ?ng l?nh (s?ng, h?p, bo? lo? ho?c n??ng). Sa lt rau xanh. Ng? c?c H?u h?t cc lo?i ng? c?c. Ch?n b?t m nguyn cm v ng? c?c nguyn cm trong h?u h?t cc l?n ?n. G?o v pasta, bao g?m g?o l?t v pasta b?ng b?t m nguyn cm. Th?t v cc lo?i protein khc Th?t b, th?t b, th?t heo v th?t c?u non n?c, l?ng m? v da. Th?t g v th?t g  ty b? da. T?t c? cc loi c v ??ng v?t c v? (tm cua, s h?n). V?t tr?i, th?, g li v h??u nai. Lng tr?ng tr?ng ho?c th?c ph?m thay th? tr?ng t cholesterol. ??u kh, ??u H Lan, ??u l?ng v ??u h?. Cc lo?i h?t v h?u h?t qu? h?ch. S?a Cc lo?i pho mt t ho?c khng c ch?t bo, bao g?m ricotta v mozzarella. S?a g?y ho?c s?a 1% (d?ng l?ng, b?t ho?c s?a ??c). Kem s?a ???c lm b?ng s?a t ch?t bo. S?a chua khng ho?c t bo. M? v d?u B? th?c v?t khng hydro ha (khng c ch?t bo chuy?n ha). D?u th?c v?t, bao g?m d?u ??u nnh, d?u m, d?u h??ng d??ng, d?u  liu, d?u b?, d?u ??u ph?ng, d?u hoa rum, d?u b?p, d?u canola v d?u h?t bng. N??c s?t rau tr?n ho?c s?t mayonnaise lm b?ng d?u th?c v?t. ?? u?ng N??c (khong ho?c c ga). C ph v tr. Tr ? khng ???ng. ??  u?ng ?n king. ?? ng?t v cc mn trng mi?ng [place], gelatin v kem ? tri cy. L??ng nh? chocolate ?en. Gi?i h?n t?t c? cc lo?i k?o v ?? trng mi?ng. Gia v? T?t c? cc lo?i gia v?. Nh?ng th?c ph?m li?t k ? trn c th? khng ph?i l m?t danh m?c ??y ?? cc lo?i th?c ph?m v ?? u?ng m qu v? c th? ?n. Lin h? v?i chuyn gia dinh d??ng ?? c thm s? l?a ch?n. Ti nn trnh nh?ng th?c ph?m no? Tri cy Tri cy ?ng h?p ngm xi-r ??c. Tri cy ngm trong kem ho?c n??c x?t b?. Tri cy kh. H?n ch? dng d?a. Rau c? Rau n?u km pho mt, kem, ho?c s?t b?. Rau chin. Ng? c?c Bnh m lm b?ng ch?t bo bo ha ho?c ch?t bo chuy?n ha, d?u ho?c s?a nguyn kem. Bnh s?ng b. Cc lo?i bnh cu?n ng?t. Bnh rn. Bnh [place] gin nhi?u ch?t bo, ch?ng h?n nh? bnh Tresten gin c pho mt v khoai ty chin. Th?t v cc lo?i protein khc Th?t nhi?u m?, ch?ng h?n nh? hotdog, s??n, xc xch, th?t xng khi, th?t quay ho?c th?t n??ng. Cc lo?i th?t deli nhi?u m?, ch?ng h?n nh? xc xch  v bologna. Tr?ng c mu?i. Th?t v?t v ng?ng nui. Th?t n?i t?ng, ch?ng h?n nh? gan. S?a Kem, kem chua, pho mt kem v pho mt t??i kem. Pho mt s?a  nguyn kem. S?a nguyn kem ho?c s?a 2% (d?ng l?ng, b?t ho?c s?a ??c). Kem s?a nguyn kem. S?t kem ho?c s?t pho mt nhi?u ch?t bo. S?a chua t? s?a nguyn kem. M? v d?u Th?t m?, ho?c ch?t bo t? d?u th?c v?t. B? cacao, d?u hydro ha, d?u c?, d?u d?a, d?u h?t c?. Ch?t bo v m? pha bnh d?ng r?n, bao g?m m? t? th?t xng khi, th?t l?n mu?i, m? l?n v b?. Th?c ph?m thay th? kem khng s?a. N??c tr?n salad c pho mt ho?c kem chua. ?? u?ng N??c soda v cc th?c u?ng thng th??ng c thm ???ng. ?? ng?t v cc mn trng mi?ng Kem trang tr b?ng b? s?a. Bnh pudding. Bnh cookie. Bnh n??ng. Bnh c nhn. Chocolate c s?a ho?c chocolate tr?ng. Si r c b?Evans Lance ? nguyn ch?t bo ho?c ?? u?ng c kem ?. Nh?ng th?c ph?m li?t k ? trn c th? khng ph?i l m?t danh m?c ??y ?? cc th?c ph?m v ?? u?ng c?n trnh. Hy lin h? v?i chuyn gia dinh d??ng ?? c thm thng tin. Tm t?t Ln k? ho?ch b?a ?n c l?i cho tim bao g?m vi?c gi?i h?n cc ch?t bo khng t?t cho s?c kh?e, t?ng cc ch?t bo t?t cho s?c kh?e, h?n ch? mu?i (natri) v th?c hi?n cc thay ??i khc v? ch? ?? ?n v l?i s?ng. Gi?m cn n?u qu v? th?a cn. Gia?m ch? 5-10% s? cn n?ng c? th? c th? gip i?ch cho s?c kh?e t?ng th? c?a [place]? vi? v ng?n ng?a cc b?nh nh? ti?u ???ng v b?nh tim. T?p trung vo vi?c ?n cn ??i cc th?c ph?m, bao g?m tri cy v rau c?, s?a t bo ho?c khng bo, protein th?t n?c, qu? h?ch v qu? ??u, ng? c?c nguyn cm v d?u v m? t?t cho tim. Thng tin ny khng nh?m m?c ?ch thay th? cho l?i khuyn m chuyn gia ch?m [place] s?c kh?e ni v?i qu v?. Hy b?o ??m qu v? ph?i th?o lu?n b?t k? v?n ??  g m qu v? c v?i chuyn gia ch?m [place] s?c kh?e c?a qu v?. Document Revised: 01/05/2022 Document Reviewed: 01/05/2022 Elsevier Patient Education  2024 [employer].

## 2023-06-13 NOTE — Telephone Encounter (Signed)
Pt was seen and evaluated by Jari Favre, PA today.  Please see that documentation for further information.

## 2023-06-14 ENCOUNTER — Ambulatory Visit (HOSPITAL_COMMUNITY): Payer: 59

## 2023-06-20 ENCOUNTER — Ambulatory Visit (HOSPITAL_COMMUNITY)
Admission: RE | Admit: 2023-06-20 | Discharge: 2023-06-20 | Disposition: A | Discharge status: Home / Self Care | Payer: 59 | Source: Ambulatory Visit | Attending: Internal Medicine | Admitting: Internal Medicine

## 2023-06-20 DIAGNOSIS — B182 Chronic viral hepatitis C: Secondary | ICD-10-CM | POA: Diagnosis not present

## 2023-06-20 DIAGNOSIS — R932 Abnormal findings on diagnostic imaging of liver and biliary tract: Secondary | ICD-10-CM | POA: Diagnosis not present

## 2023-06-20 DIAGNOSIS — B181 Chronic viral hepatitis B without delta-agent: Secondary | ICD-10-CM | POA: Diagnosis not present

## 2023-06-20 DIAGNOSIS — K709 Alcoholic liver disease, unspecified: Secondary | ICD-10-CM | POA: Diagnosis not present

## 2023-06-20 DIAGNOSIS — Z944 Liver transplant status: Secondary | ICD-10-CM | POA: Diagnosis not present

## 2023-06-25 ENCOUNTER — Telehealth: Payer: Self-pay | Admitting: Cardiology

## 2023-06-25 NOTE — Telephone Encounter (Signed)
Will send to Dr Anne Fu for review and inform son once this has been completed.

## 2023-06-25 NOTE — Telephone Encounter (Signed)
Pt c/o medication issue:  1. Name of Medication: isosorbide mononitrate (IMDUR) 30 MG 24 hr tablet   2. How are you currently taking this medication (dosage and times per day)? As directed  3. Are you having a reaction (difficulty breathing--STAT)? no  4. What is your medication issue? Patient son called in because his father recently had a stent placed. After speaking with other people who have had stents placed before he was told that they no longer have to take this medication. He wants to know if his father should continue taking this medication or not.

## 2023-06-26 NOTE — Telephone Encounter (Signed)
Follow Up:    Son was calling back to see what Dr Anne Fu said about the medicine.

## 2023-06-26 NOTE — Telephone Encounter (Signed)
Awaiting Dr Anne Fu review.

## 2023-06-26 NOTE — Telephone Encounter (Signed)
They may stop the medication, isosorbide. If chest discomfort returns, we may need to restart. Donato Schultz, MD    Pt's son aware OK to d/c per Dr Anne Fu.  He will call pt if pt develops any new chest pain/discomfort.

## 2023-06-29 ENCOUNTER — Telehealth (HOSPITAL_COMMUNITY): Payer: Self-pay

## 2023-06-29 NOTE — Telephone Encounter (Signed)
Pt not interested in the cardiac rehab program. Closed referral

## 2023-08-07 ENCOUNTER — Ambulatory Visit: Payer: 59 | Admitting: Internal Medicine

## 2023-08-23 ENCOUNTER — Telehealth: Payer: Self-pay | Admitting: Cardiology

## 2023-08-23 NOTE — Telephone Encounter (Signed)
   Pre-operative Risk Assessment    Patient Name: Brian Preston  DOB: 04/27/1971 MRN: [id number]      Request for Surgical Clearance    Procedure:   Excision of bilateral brachial cleft sinus   Date of Surgery:  Clearance 09/20/23                                 Surgeon:  Dr. Scarlette Ar Surgeon's Group or Practice Name:  [facility]  Phone number:  [phone] Fax number:  [phone]   Type of Clearance Requested:   - Medical  - Pharmacy:  Hold Aspirin     Type of Anesthesia:  General    Additional requests/questions:    Alben Spittle   08/23/2023, 8:51 AM

## 2023-08-23 NOTE — Telephone Encounter (Signed)
   Name: Brian Preston  DOB: 27-Apr-1971  MRN: [id number]  Primary Cardiologist: Donato Schultz, MD  Chart reviewed as part of pre-operative protocol coverage. The patient has an upcoming visit scheduled with Robin Searing, NP on 08/24/2023 at which time clearance can be addressed in case there are any issues that would impact surgical recommendations.  Excision of bilateral brachial cleft sinus is not scheduled until 09/20/2023 as below. I added preop FYI to appointment note so that provider is aware to address at time of outpatient visit.  Per office protocol the cardiology provider should forward their finalized clearance decision and recommendations regarding antiplatelet therapy to the requesting party below.    I will route this message as FYI to requesting party and remove this message from the preop box as separate preop APP input not needed at this time.   Please call with any questions.  Joylene Grapes, NP  08/23/2023, 12:36 PM

## 2023-08-23 NOTE — Progress Notes (Signed)
Cardiology Office Note    Patient Name: Brian Preston Date of Encounter: 08/23/2023  Primary Care Provider:  Erick Alley, DO Primary Cardiologist:  Donato Schultz, MD Primary Electrophysiologist: None   Past Medical History    Past Medical History:  Diagnosis Date   CAD (coronary artery disease) 05/28/2023   - TTE 01/22/2023: EF 60-65, no RWMA, G1 DD, normal RVSF, normal PASP, trivial MR, RAP 3 - CCTA 02/12/2023: CAC score 76.9 (87 percentile); total plaque volume 60th percentile (moderate) LAD proximal 50-69, mid <25, D1 <25; LCx proximal <25, OM 2 proximal >70; LAD FFR mid 0.8, distal 0.76 (borderline), mid OM2 0.65 (high likelihood)>>Med Rx unless recurrent CP or worse Sx's    Chest pain    Hepatitis B    ID   Hypertension     History of Present Illness  [name] is a 52 y.o. male with a PMH of CAD s/p LHC 05/2023 with PCI/DES to circumflex/OM 2, HTN, family history of CAD, hep B, hepatic cirrhosis who presents today for clearance.  [name] was seen initially by Dr. Anne Fu for complaint of chest pain on 12/2022.  He underwent coronary CTA that revealed calcium score of 76.9 with 70% mixed plaque in left circumflex and FFR demonstrating significant stenosis.  He was seen in follow-up by Tereso Newcomer, PA on 05/29/2023 with complaint of constant chest pain and was sent for left heart cath that revealed 90% mid circumflex lesion treated with PCI/DES x 1, proximal LAD to mid LAD lesion of 50%.  He was started on DAPT and was seen in follow-up on 06/13/2023 and reported some chest discomfort which was associated with the loss of his mother recently.  His blood pressure was 89/62 and patient was symptomatic with lightheadedness and dizziness.  His lisinopril/HCTZ was discontinued and patient was advised to track BP.  He presents today for preoperative clearance visit.  During today's visit the patient reports that he has been doing well but does report chest pain and tightness with emotional  upset.  He is still mourning the loss of his mother and states that he feels chest tightness and it feels hard to breathe when he is upset. His blood pressure today was controlled and much improved at 112/68 and heart rate is 68 bpm.  He is able to do activities without discomfort such as walking on a treadmill and walking up stairs however he does feel reproducible discomfort with emotional upset.  He is scheduled to have a cleft sinus procedure and presented today for surgical clearance.  He reports compliance with his current medications and denies any adverse reactions.  He will be traveling out of the country in December and may return in 3 to 4 months. Patient denies chest pain, palpitations, dyspnea, PND, orthopnea, nausea, vomiting, dizziness, syncope, edema, weight gain, or early satiety.  Review of Systems  Please see the history of present illness.    All other systems reviewed and are otherwise negative except as noted above.  Physical Exam    Wt Readings from Last 3 Encounters:  06/13/23 136 lb 6.4 oz (61.9 kg)  06/12/23 137 lb (62.1 kg)  05/30/23 135 lb (61.2 kg)   ZD:GUYQI were no vitals filed for this visit.,There is no height or weight on file to calculate BMI. GEN: Well nourished, well developed in no acute distress Neck: No JVD; No carotid bruits Pulmonary: Clear to auscultation without rales, wheezing or rhonchi  Cardiovascular: Normal rate. Regular rhythm. Normal S1. Normal S2.  Murmurs: There is no murmur.  ABDOMEN: Soft, non-tender, non-distended EXTREMITIES:  No edema; No deformity   EKG/LABS/ Recent Cardiac Studies   ECG personally reviewed by me today -sinus with early repolarization and no acute changes consistent with previous EKG.  Risk Assessment/Calculations:          Lab Results  Component Value Date   WBC 8.1 05/29/2023   HGB 15.8 05/29/2023   HCT 48.3 05/29/2023   MCV 86 05/29/2023   PLT 308 05/29/2023   Lab Results  Component Value Date    CREATININE 0.89 05/29/2023   BUN 16 05/29/2023   NA 139 05/29/2023   K 3.9 05/29/2023   CL 100 05/29/2023   CO2 27 05/29/2023   Lab Results  Component Value Date   CHOL 114 03/05/2023   HDL 42 03/05/2023   LDLCALC 48 03/05/2023   TRIG 138 03/05/2023   CHOLHDL 2.7 03/05/2023    Lab Results  Component Value Date   HGBA1C 5.5 01/04/2018   Assessment & Plan    1.  Preoperative clearance: -Patient's RCRI score is 6.6% -Patient will require Lexiscan Myoview for clearance due to reproducible chest pain -He is able to complete greater than 4 METS of activity with no difficulty.  2.  Coronary artery disease: -s/p LHC completed 05/2023 with PCI/DES placed to OM1 and proximal LAD to mid LAD lesion of 50% -Patient reports chest tightness and discomfort with emotional upset that is reproducible and relieved with rest. -He has not tried nitroglycerin and was encouraged to try nitroglycerin 0.4 mg as needed -Continue current GDMT with ASA 81 mg, Plavix 75 mg, Crestor 40 mg, Toprol-XL 25 mg  3.  Hyperlipidemia: -Patient's LDL cholesterol was 48 -Continue Crestor 40 mg daily  4.  Essential hypertension: -Patient's blood pressure today was better controlled today at 112/68 -He will continue Toprol-XL 12.5 mg daily  5.  Chest pain: -Patient reports chest tightness and discomfort with emotional upset. -Patient encouraged to try as needed Nitrostat and is aware to seek care in the ED if pain is not relieved with as needed nitroglycerin. -We will evaluate with an exercise Myoview as noted above.   Disposition: Follow-up with Donato Schultz, MD or APP in 6 months Informed Consent   Shared Decision Making/Informed Consent The risks [chest pain, shortness of breath, cardiac arrhythmias, dizziness, blood pressure fluctuations, myocardial infarction, stroke/transient ischemic attack, nausea, vomiting, allergic reaction, radiation exposure, metallic taste sensation and life-threatening complications  (estimated to be 1 in 10,000)], benefits (risk stratification, diagnosing coronary artery disease, treatment guidance) and alternatives of a nuclear stress test were discussed in detail with [name] and he agrees to proceed.      Signed, Napoleon Form, Leodis Rains, NP 08/23/2023, 7:48 PM [facility]

## 2023-08-24 ENCOUNTER — Encounter: Payer: Self-pay | Admitting: Nurse Practitioner

## 2023-08-24 ENCOUNTER — Ambulatory Visit: Payer: 59 | Attending: Physician Assistant | Admitting: Nurse Practitioner

## 2023-08-24 VITALS — BP 112/68 | HR 68 | Resp 16 | Ht 64.0 in | Wt 136.0 lb

## 2023-08-24 DIAGNOSIS — Z01810 Encounter for preprocedural cardiovascular examination: Secondary | ICD-10-CM | POA: Diagnosis not present

## 2023-08-24 DIAGNOSIS — R072 Precordial pain: Secondary | ICD-10-CM | POA: Diagnosis not present

## 2023-08-24 DIAGNOSIS — I10 Essential (primary) hypertension: Secondary | ICD-10-CM | POA: Diagnosis not present

## 2023-08-24 DIAGNOSIS — I2510 Atherosclerotic heart disease of native coronary artery without angina pectoris: Secondary | ICD-10-CM

## 2023-08-24 DIAGNOSIS — E785 Hyperlipidemia, unspecified: Secondary | ICD-10-CM | POA: Diagnosis not present

## 2023-08-24 MED ORDER — NITROGLYCERIN 0.4 MG SL SUBL: 0.4000 mg | SUBLINGUAL_TABLET | SUBLINGUAL | Status: DC | PRN

## 2023-08-24 NOTE — Patient Instructions (Addendum)
H??ng d?n dng thu?c:  Bc s? khuyn b?n nn ti?p t?c dng cc lo?i thu?c hi?n t?i theo ch? d?n. Vui lng tham kh?o danh sch Thu?c hi?n t?i ???c cung c?p cho b?n ngy hm nay. *N?u b?n c?n b? sung thm thu?c tr? tim tr??c cu?c h?n ti?p theo, vui lng g?i cho nh thu?c c?a b?n*  Cng vi?c trong phng th nghi?m: Khng c ??n ??t hng  Ki?m tra/Th? t?c: Bc s? c?a b?n ? yu c?u b?n t?p th? d?c ?? gi?m c?ng th?ng. ?? bi?t thm thng tin vui lng truy c?p [email or web address] Hy lm theo b?ng h??ng d?n, nh? ???c ??a ra.   Theo di: T?i [employer], b?n v nhu c?u s?c kh?e c?a b?n l ?u tin hng ??u c?a chng ti.  L m?t ph?n trong s? m?nh ti?p t?c cung c?p cho b?n d?ch v? ch?m [place] tim ??c bi?t, chng ti ? thnh l?p Nhm Ch?m [place] Nh cung c?p ???c ch? ??nh.  Cc Nhm ch?m [place] ny bao g?m Bc s? tim m?ch chnh (bc s?) v Nh cung c?p d?ch v? hnh ngh? nng cao (APP - Tr? l bc s? v Y t), t?t c? ??u lm vi?c cng nhau ?? cung c?p cho b?n d?ch v? ch?m  m b?n c?n khi b?n c?n.   Chng ti khuyn b?n nn ??ng k c?ng thng tin b?nh nhn c tn "MyChart".  Thng tin ??ng k ???c cung c?p trn B?n tm t?t sau chuy?n th?m ny.  MyChart ???c s? d?ng ?? k?t n?i v?i b?nh nhn trong cc Cu?c th?m khm ?o (Telemedicine).  B?nh nhn c th? xem k?t qu? xt nghi?m/phng th nghi?m, ghi ch cu?c g?p, cu?c h?n s?p t?i, v.v. Cc tin nh?n khng kh?n c?p c?ng c th? ???c g?i ??n nh cung c?p c?a b?n.   ?? tm hi?u thm v? nh?ng g b?n c th? lm v?i MyChart, hy truy c?p [email or web address].   Cu?c h?n ti?p theo c?a b?n:   6 thng  Nh cung c?p:   Donato Schultz, MD   Medication Instructions:  Your physician recommends that you continue on your current medications as directed. Please refer to the Current Medication list given to you today. *If you need a refill on your cardiac medications before your next appointment, please call your pharmacy*   Lab Work: None  ordered   Testing/Procedures: Your physician has requested that you have an exercise stress myoview. For further information please visit [email or web address] Please follow instruction sheet, as given.   Follow-Up: At [facility], you and your health needs are our priority.  As part of our continuing mission to provide you with exceptional heart care, we have created designated Provider Care Teams.  These Care Teams include your primary Cardiologist (physician) and Advanced Practice Providers (APPs -  Physician Assistants and Nurse Practitioners) who all work together to provide you with the care you need, when you need it.  We recommend signing up for the patient portal called "MyChart".  Sign up information is provided on this After Visit Summary.  MyChart is used to connect with patients for Virtual Visits (Telemedicine).  Patients are able to view lab/test results, encounter notes, upcoming appointments, etc.  Non-urgent messages can be sent to your provider as well.   To learn more about what you can do with MyChart, go to [email or web address].    Your next appointment:   6 month(s)  Provider:   Donato Schultz, MD     Other  Instructions

## 2023-08-31 ENCOUNTER — Ambulatory Visit: Payer: 59 | Admitting: Physician Assistant

## 2023-09-03 ENCOUNTER — Encounter (HOSPITAL_COMMUNITY): Payer: 59

## 2023-09-06 ENCOUNTER — Telehealth (HOSPITAL_COMMUNITY): Payer: Self-pay | Admitting: *Deleted

## 2023-09-06 NOTE — Telephone Encounter (Signed)
Patient given detailed instructions per Myocardial Perfusion Study Information Sheet for the test on 09/10/23 at 7:45. Patient notified to arrive 15 minutes early and that it is imperative to arrive on time for appointment to keep from having the test rescheduled.  If you need to cancel or reschedule your appointment, please call the office within 24 hours of your appointment. . Patient verbalized understanding.[name] \

## 2023-09-10 ENCOUNTER — Ambulatory Visit (HOSPITAL_COMMUNITY): Payer: 59 | Attending: Internal Medicine

## 2023-09-10 DIAGNOSIS — I2510 Atherosclerotic heart disease of native coronary artery without angina pectoris: Secondary | ICD-10-CM

## 2023-09-10 DIAGNOSIS — Z01810 Encounter for preprocedural cardiovascular examination: Secondary | ICD-10-CM | POA: Diagnosis not present

## 2023-09-10 DIAGNOSIS — I10 Essential (primary) hypertension: Secondary | ICD-10-CM

## 2023-09-10 DIAGNOSIS — E785 Hyperlipidemia, unspecified: Secondary | ICD-10-CM | POA: Diagnosis not present

## 2023-09-10 LAB — MYOCARDIAL PERFUSION IMAGING
Angina Index: 0
Duke Treadmill Score: 11
Estimated workload: 13.4
Exercise duration (min): 11 min
LV dias vol: 55 mL (ref 62–150)
LV sys vol: 20 mL
MPHR: 169 {beats}/min
Nuc Stress EF: 63 %
Peak HR: 169 {beats}/min
Percent HR: 100 %
RPE: 15
Rest HR: 62 {beats}/min
Rest Nuclear Isotope Dose: 8.2 mCi
SDS: 1
SRS: 0
SSS: 1
ST Depression (mm): 0 mm
Stress Nuclear Isotope Dose: 26 mCi
TID: 0.91

## 2023-09-10 MED ORDER — TECHNETIUM TC 99M TETROFOSMIN IV KIT
26.0000 | PACK | Freq: Once | INTRAVENOUS | Status: AC | PRN
  Administered 2023-09-10: 26 via INTRAVENOUS

## 2023-09-10 MED ORDER — TECHNETIUM TC 99M TETROFOSMIN IV KIT
8.2000 | PACK | Freq: Once | INTRAVENOUS | Status: AC | PRN
  Administered 2023-09-10: 8.2 via INTRAVENOUS

## 2023-09-20 ENCOUNTER — Telehealth: Payer: Self-pay | Admitting: Cardiology

## 2023-09-20 NOTE — Telephone Encounter (Signed)
Have recommended that he complete 6 months of dual antiplatelet therapy prior to ENT sinus surgery.   Stents placed in OM 05/2023. Reasonable to proceed in 11/2022 and hold plavix and asa 5 days prior to procedure and resume 2 days post op.   Spoke with Dr. Ernestene Kiel.   Brian Schultz, MD

## 2023-09-20 NOTE — Telephone Encounter (Signed)
Dr. Honor Junes stated he wants to consult with cardiologist before procedure.

## 2023-09-25 ENCOUNTER — Telehealth: Payer: Self-pay | Admitting: Cardiology

## 2023-09-25 ENCOUNTER — Other Ambulatory Visit: Payer: Self-pay

## 2023-09-25 MED ORDER — ROSUVASTATIN CALCIUM 40 MG PO TABS: 40.0000 mg | ORAL_TABLET | Freq: Every day | ORAL | 2 refills | Status: DC

## 2023-09-25 NOTE — Telephone Encounter (Signed)
*  STAT* If patient is at the pharmacy, call can be transferred to refill team.   1. Which medications need to be refilled? (please list name of each medication and dose if known) rosuvastatin (CRESTOR) 40 MG tablet    2. Would you like to learn more about the convenience, safety, & potential cost savings by using the [facility]? No      3. Are you open to using the [facility] (Type [facility]. No  ).   4. Which pharmacy/location (including street and city if local pharmacy) is medication to be sent to? [facility] - [place], [place] - [address]    5. Do they need a 30 day or 90 day supply? 90

## 2023-09-25 NOTE — Telephone Encounter (Signed)
Already filled

## 2023-10-04 IMAGING — US US ABDOMEN LIMITED
1 series · 15 of 25 positions shown · non-contrast
Comparison: May 25, 2021 right upper quadrant ultrasound

CLINICAL DATA: Chronic hepatitis B

EXAM:
ULTRASOUND ABDOMEN LIMITED RIGHT UPPER QUADRANT

[Series 1: us abdomen limited ruq mc & wl · 15 of 35 slices shown]
[im 1/35]
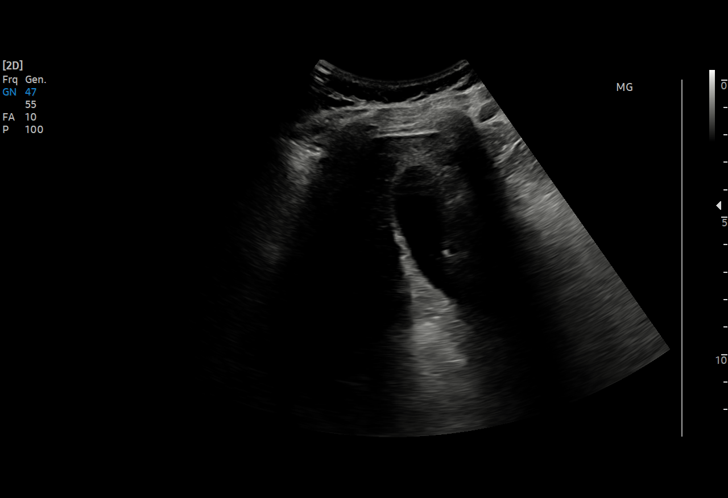
[im 3/35]
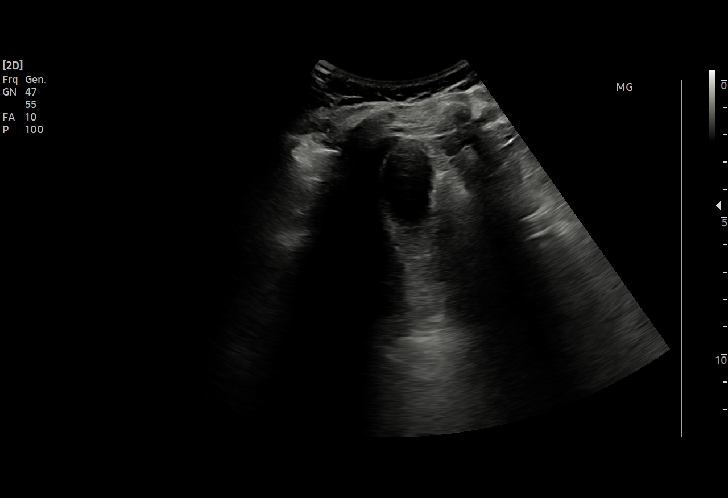
[im 6/35]
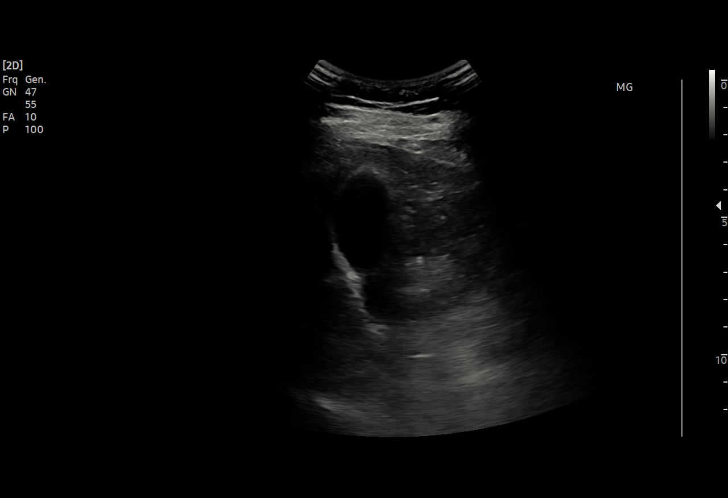
[im 8/35]
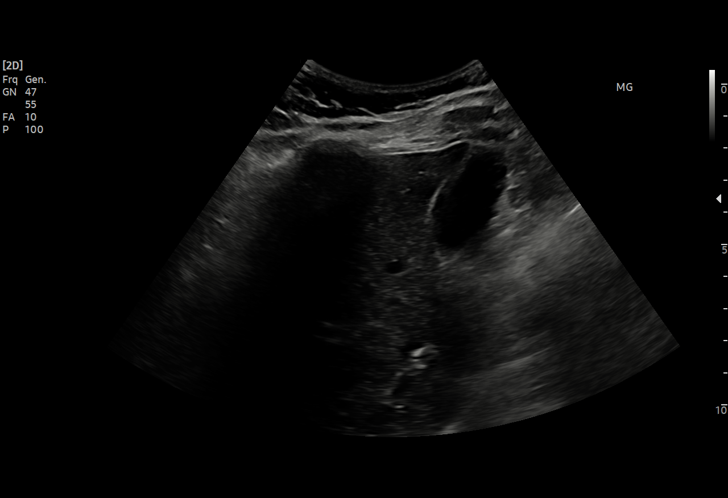
[im 10/35]
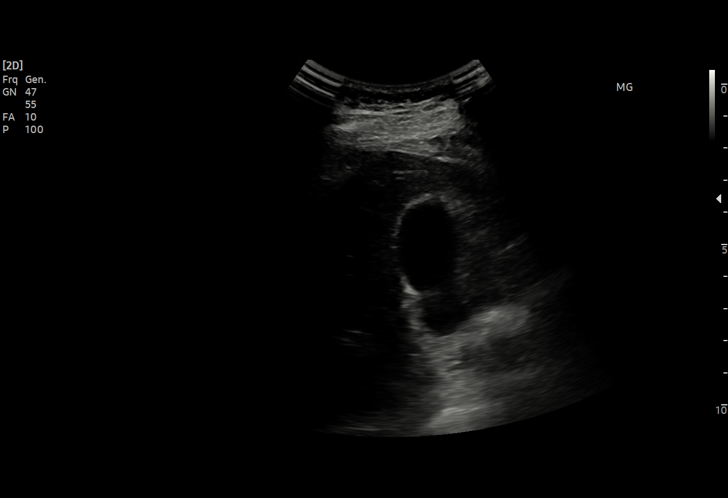
[im 13/35]
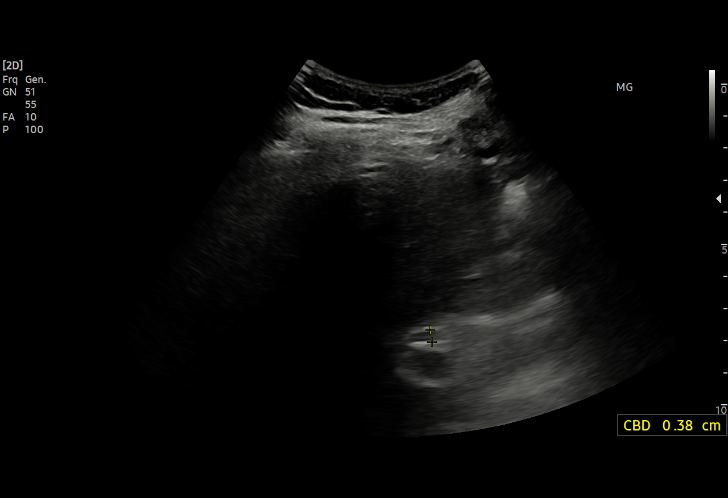
[im 15/35]
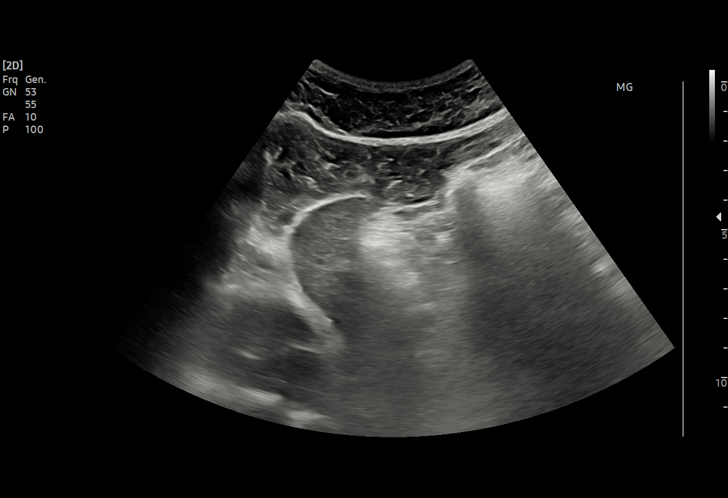
[im 18/35]
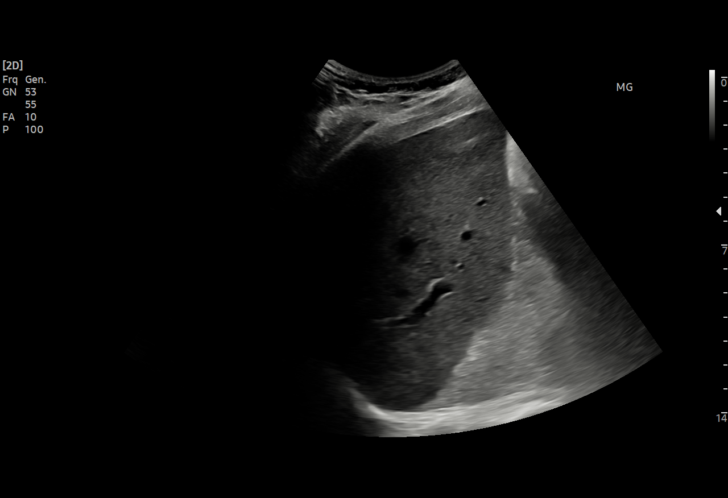
[im 20/35]
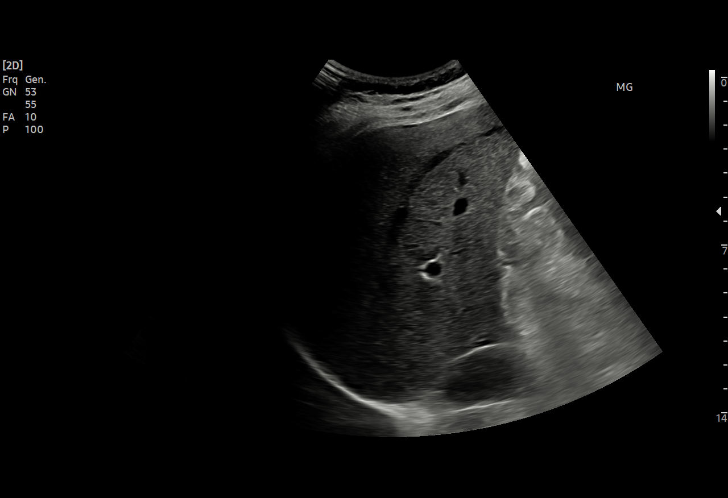
[im 22/35]
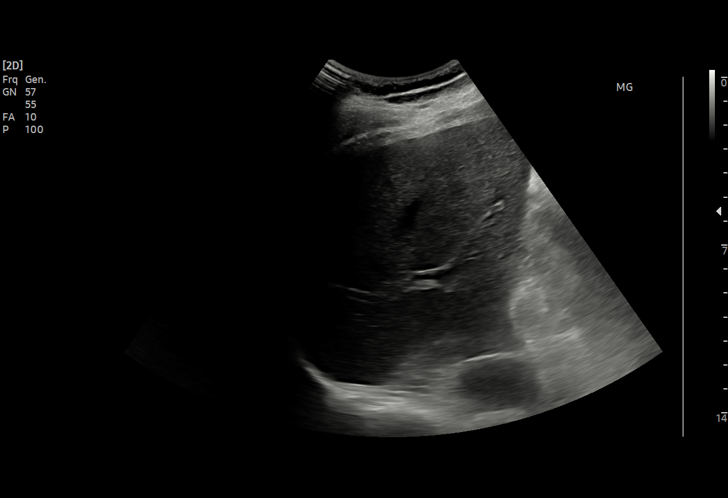
[im 25/35]
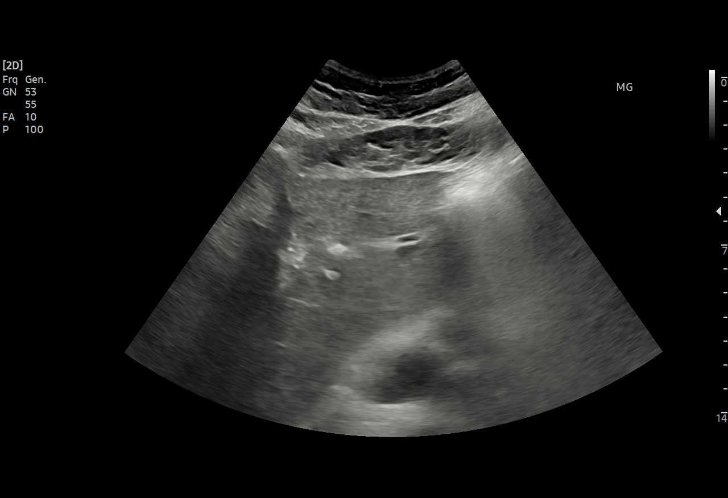
[im 27/35]
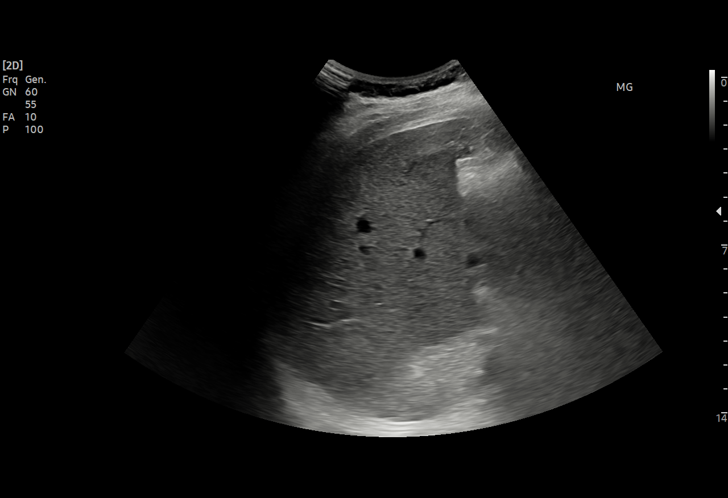
[im 29/35]
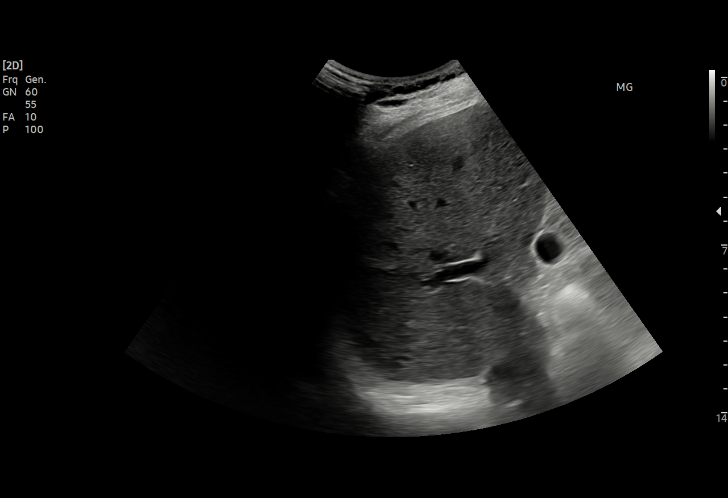
[im 32/35]
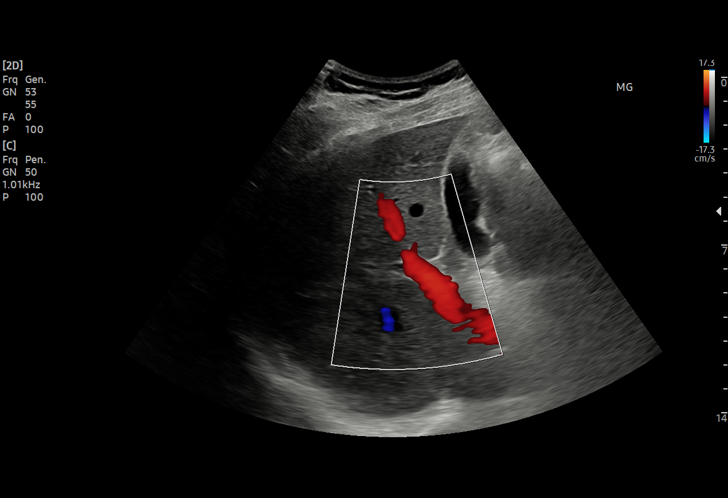
[im 35/35]
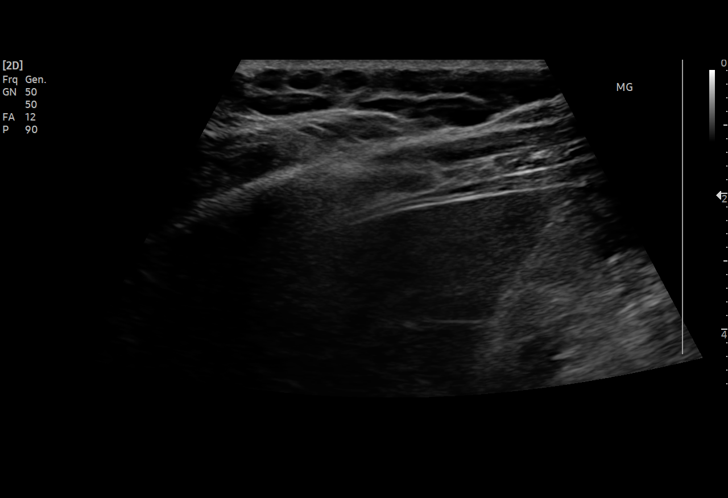

[15 of 25 positions shown; findings below may reference images not displayed]

FINDINGS: Gallbladder:

No gallstones or wall thickening visualized. No sonographic Murphy
sign noted by sonographer.

Common bile duct:

Diameter: 3.8 mm

Liver:

No focal lesion identified. Within normal limits in parenchymal
echogenicity. Portal vein is patent on color Doppler imaging with
normal direction of blood flow towards the liver.

Other: None.
IMPRESSION: 1. Normal study.

## 2023-11-12 ENCOUNTER — Other Ambulatory Visit: Payer: Self-pay | Admitting: Physician Assistant

## 2024-04-22 ENCOUNTER — Encounter: Payer: Self-pay | Admitting: *Deleted

## 2024-05-05 ENCOUNTER — Other Ambulatory Visit: Payer: Self-pay | Admitting: Student

## 2024-05-05 DIAGNOSIS — I10 Essential (primary) hypertension: Secondary | ICD-10-CM

## 2024-05-30 ENCOUNTER — Ambulatory Visit (INDEPENDENT_AMBULATORY_CARE_PROVIDER_SITE_OTHER): Admitting: Family Medicine

## 2024-05-30 ENCOUNTER — Encounter: Payer: Self-pay | Admitting: Family Medicine

## 2024-05-30 VITALS — BP 119/86 | HR 81 | Ht 64.0 in | Wt 138.0 lb

## 2024-05-30 DIAGNOSIS — B181 Chronic viral hepatitis B without delta-agent: Secondary | ICD-10-CM

## 2024-05-30 DIAGNOSIS — I25119 Atherosclerotic heart disease of native coronary artery with unspecified angina pectoris: Secondary | ICD-10-CM | POA: Diagnosis not present

## 2024-05-30 DIAGNOSIS — I10 Essential (primary) hypertension: Secondary | ICD-10-CM

## 2024-05-30 DIAGNOSIS — R079 Chest pain, unspecified: Secondary | ICD-10-CM

## 2024-05-30 DIAGNOSIS — K219 Gastro-esophageal reflux disease without esophagitis: Secondary | ICD-10-CM | POA: Diagnosis not present

## 2024-05-30 MED ORDER — PANTOPRAZOLE SODIUM 40 MG PO TBEC: 40.0000 mg | DELAYED_RELEASE_TABLET | Freq: Two times a day (BID) | ORAL | 0 refills | Status: DC

## 2024-05-30 MED ORDER — LISINOPRIL-HYDROCHLOROTHIAZIDE 10-12.5 MG PO TABS
1.0000 | ORAL_TABLET | Freq: Every day | ORAL | 4 refills | Status: DC
Start: 2024-05-30 — End: 2024-07-08

## 2024-05-30 MED ORDER — NITROGLYCERIN 0.4 MG SL SUBL
0.4000 mg | SUBLINGUAL_TABLET | SUBLINGUAL | 0 refills | Status: DC | PRN
Start: 2024-05-30 — End: 2024-07-08

## 2024-05-30 NOTE — Progress Notes (Signed)
 SUBJECTIVE:   Chief compliant/HPI: annual examination  Brian Preston is a 53 y.o. who presents today for an annual exam.   History tabs reviewed and updated.   Review of systems form reviewed and notable for concerns around sexual performance, instructed patient to make follow-up appointment to assess his further.   Has been taking lisinopril /hydrochlorothiazide  10-12.5 daily, checks daily and BP is well controlled (115/80).  Ran out of this yesterday.   Stopped metoprolol  8 months after cardiologist told him to stop. Also it makes him dizzy.  Stopped ASA 1 month ago because he was having a lot of bruising, his brother told him to stop.  Has been taking Plavix  daily  Has been taking Protonix  intermittently when symptomatic  Has run out of Nitrostat , request refill  States he was told by infectious disease that he does not need to follow-up with them any longer for his chronic HBV  OBJECTIVE:   BP 119/86   Pulse 81   Ht 5' 4 (1.626 m)   Wt 138 lb (62.6 kg)   SpO2 100%   BMI 23.69 kg/m   General: Middle-age male, awake and conversant, no acute distress CV: RRR, normal S1/S2, no M/R/G Respiratory: CTAB, normal WOB on RA Abdomen: Normoactive bowel sounds, soft, nontender, nondistended, no HSM Neuro: No focal deficits, normal gait Psych: Appropriate mood and affect  ASSESSMENT/PLAN:   Assessment & Plan Chronic hepatitis B (HCC) Last HFP checked a year ago, was normal.  Will recheck today as patient is no longer following with ID for this. - Hepatic Function Panel Chest pain, unspecified type Patient asymptomatic today.  Providing refill of as needed Nitrostat  - nitroGLYCERIN  (NITROSTAT ) 0.4 MG SL tablet; Place 1 tablet (0.4 mg total) under the tongue every 5 (five) minutes as needed.  Dispense: 30 tablet; Refill: 0  Essential hypertension Patient has been taking lisinopril -HCTZ daily and blood pressure is at goal today.  Checks blood pressure daily and reports it is  usually 115/80s.  Was also taking metoprolol  as prescribed by cardiology last year, states this made him dizzy and he was having trouble at work.  States he asked the cardiologist about this and they told him that he could stop taking it about 8 months ago.  Do not see documentation of this in the chart.   -Has cardiology follow-up at the end of August, instructed patient to inquire about the need to restart this medicine at that time. -Continue lisinopril -hydrochlorothiazide  (ZESTORETIC ) 10-12.5 MG tablet; Take 1 tablet by mouth daily.  Dispense: 90 tablet; Refill: 4  Gastroesophageal reflux disease, unspecified whether esophagitis present States symptoms are intermittent and he takes this as needed. - pantoprazole  (PROTONIX ) 40 MG tablet; Take 1 tablet (40 mg total) by mouth 2 (two) times daily.  Dispense: 60 tablet; Refill: 0  Coronary artery disease involving native coronary artery of native heart with angina pectoris [facility]) S/p PCI/DES in July 2024.  Prescribed DAPT at that time.  Has been adherent with Plavix  and aspirin  up until 1 month ago when he self discontinued aspirin  due to concerns about easy bruising.   - Instructed patient to resume aspirin  daily and inquire about length of DAPT at cardiology follow-up appointment. Annual Examination  See AVS for age appropriate recommendations.  PHQ score 0, reviewed and discussed.  Blood pressure value is 119/86 goal, discussed.   Considered the following screening exams based upon USPSTF recommendations: Diabetes screening: discussed A1c in 2020 5.5, no concerns today Screening for elevated cholesterol: discussed LDL  48 last year, patient adherent with statin HIV testing: discussed patient denies new sexual partners, negative in 2018 Hepatitis C: discussed negative in 2018, no new risk factors today Hepatitis B: Completed last year at ID appointment, patient states he has been cleared from their office due to his stability Syphilis if at high  risk: discussed Reviewed risk factors for latent tuberculosis and not indicated Colorectal cancer screening: up to date on screening for CRC. Lung cancer screening: N/A, remote smoking history does not meet criteria for low-dose CT scan See documentation below regarding discussion and indication.  PSA discussed and after engaging in discussion of possible risks, benefits and complications of screening patient elected to forego, denies symptoms at this time.   Follow up in 1 year or sooner if indicated.  MyChart Activation: Already signed up   Rea Raring, MD [facility]

## 2024-05-30 NOTE — Assessment & Plan Note (Signed)
 S/p PCI/DES in July 2024.  Prescribed DAPT at that time.  Has been adherent with Plavix  and aspirin  up until 1 month ago when he self discontinued aspirin  due to concerns about easy bruising.   - Instructed patient to resume aspirin  daily and inquire about length of DAPT at cardiology follow-up appointment.

## 2024-05-30 NOTE — Patient Instructions (Addendum)
 Thank you for coming in today! Here is a summary of what we discussed:  - Please start taking the aspirin  81mg  daily again, you can ask the cardiologist about this and the Plavix  at your appointment  - Please also ask the cardiologist about whether you should be taking the metoprolol   - Keep taking your blood pressure medicine daily and measuring your blood pressure at home  - You can make another appointment at your convenience to talk about your other concerns  We are checking some labs today. If they are abnormal, I will call you. If they are normal, I will send you a MyChart message (if it is active) or a letter in the mail. If you do not hear about your labs in the next 2 weeks, please call the office.   Please call the clinic at [phone] if your symptoms worsen or you have any concerns.  Best, Dr Adele

## 2024-05-30 NOTE — Assessment & Plan Note (Addendum)
 States symptoms are intermittent and he takes this as needed. - pantoprazole  (PROTONIX ) 40 MG tablet; Take 1 tablet (40 mg total) by mouth 2 (two) times daily.  Dispense: 60 tablet; Refill: 0

## 2024-05-30 NOTE — Assessment & Plan Note (Addendum)
 Patient has been taking lisinopril -HCTZ daily and blood pressure is at goal today.  Checks blood pressure daily and reports it is usually 115/80s.  Was also taking metoprolol  as prescribed by cardiology last year, states this made him dizzy and he was having trouble at work.  States he asked the cardiologist about this and they told him that he could stop taking it about 8 months ago.  Do not see documentation of this in the chart.   -Has cardiology follow-up at the end of August, instructed patient to inquire about the need to restart this medicine at that time. -Continue lisinopril -hydrochlorothiazide  (ZESTORETIC ) 10-12.5 MG tablet; Take 1 tablet by mouth daily.  Dispense: 90 tablet; Refill: 4

## 2024-05-30 NOTE — Assessment & Plan Note (Addendum)
 Last HFP checked a year ago, was normal.  Will recheck today as patient is no longer following with ID for this. - Hepatic Function Panel

## 2024-05-31 LAB — HEPATIC FUNCTION PANEL
ALT: 26 [IU]/L (ref 0–44)
AST: 21 [IU]/L (ref 0–40)
Albumin: 4.6 g/dL (ref 3.8–4.9)
Alkaline Phosphatase: 54 [IU]/L (ref 44–121)
Bilirubin Total: 0.3 mg/dL (ref 0.0–1.2)
Bilirubin, Direct: 0.12 mg/dL (ref 0.00–0.40)
Total Protein: 7.6 g/dL (ref 6.0–8.5)

## 2024-06-02 ENCOUNTER — Ambulatory Visit: Payer: Self-pay | Admitting: Family Medicine

## 2024-06-25 ENCOUNTER — Other Ambulatory Visit: Payer: Self-pay | Admitting: Physician Assistant

## 2024-07-07 NOTE — Progress Notes (Unsigned)
 Cardiology Office Note:  .   Date:  07/08/2024  ID:  Brian Preston, DOB 12-11-1970, MRN [id number] PCP: Adele Song, MD  [facility] Providers Cardiologist:  Oneil Parchment, MD { History of Present Illness: .   Brian Preston is a 53 y.o. male who returns for follow-up of CAD.  He was seen by Dr. Parchment 03/05/2023.  Had stable antianginal regimen.  Plan was to proceed with cardiac catheterization if he had recurring or worsening pain.  He was seen with use of an interpreter.  Over the past month he noted fairly consistent substernal chest discomfort.  Had not really noticed an significant worsening with activity.  Had not had much relief.  Did not have any nitroglycerin .  He not had any radiating symptoms, associated with nausea or diaphoresis.  He has not had any pleuritic symptoms.  He has not had symptoms with meals.  He noted occasional shortness of breath.  He also noted shortness of breath with exertion.  He not had any orthopnea, syncope, lower extremity edema.  Plan was to proceed with cardiac catheterization.  Ultimately, circumflex stent was placed.  Full cardiac catheterization below.  I saw him 06/13/23, he tells me that he had some chest pressure after his procedure.  Still somewhat there.  Nothing makes it better or worse.  Unfortunately, his mother passed away.  He has been dealing with a lot of grief lately.  His blood pressure was running low so his lisinopril  was discontinued, however, last night he slept over his father's house and did not have his blood pressure cuff.  He went ahead and took a lisinopril /HCTZ before bed.  That is why his blood pressure is 89/62 today.  Feeling a little lightheaded and dizzy.  Also, would like to transition from Brilinta  to Plavix  due to cost reasons.  We have given him instructions for discontinuing Brilinta , Plavix  load, and daily 75 mg Plavix .  He is interested in cardiac rehab and a referral was placed in the hospital.  Reports no shortness of  breath nor dyspnea on exertion. No edema, orthopnea, PND. Reports no palpitations.   Today, he presents with hypertension and palpitations.  He experiences palpitations described as 'very, very hard' and 'vigorous', primarily occurring before bed and sometimes after work when feeling tired. These episodes have been ongoing for about two to three months. He occasionally experiences dizziness and a sensation of tightness in his head and face during these episodes. There is no chest pain or shortness of breath. Blood pressure during these episodes is typically around 126 to 134 systolic.  He recently resumed gym activities about a week ago, using a treadmill. He practices a breathing technique to help alleviate the palpitations, which involves breathing in for four seconds and out for seven seconds, and finds it effective in calming the symptoms.  Current medications include metoprolol , lisinopril  HCTZ, Crestor , Plavix , nitroglycerin , aspirin , and Protonix .  Reports no shortness of breath nor dyspnea on exertion. Reports no chest pain, pressure, or tightness. No edema, orthopnea, PND.  Discussed the use of AI scribe software for clinical note transcription with the patient, who gave verbal consent to proceed.   ROS: ROS in HPI  Studies Reviewed: SABRA   EKG Interpretation Date/Time:  Tuesday July 08 2024 15:00:03 EDT Ventricular Rate:  87 PR Interval:  148 QRS Duration:  82 QT Interval:  348 QTC Calculation: 418 R Axis:   49  Text Interpretation: Normal sinus rhythm Normal ECG When compared with ECG of 24-Aug-2023  15:30, No significant change was found Confirmed by Lucien Blanc [phone]) on 07/08/2024 4:43:51 PM      Cardiac catheterization 05/30/2023 Left Anterior Descending  Prox LAD to Mid LAD lesion is 50% stenosed.    First Diagonal Branch  Vessel is small in size.    Left Circumflex  Prox Cx lesion is 20% stenosed.  Mid Cx lesion is 90% stenosed.  Mid Cx to Dist Cx lesion is 70%  stenosed.    First Obtuse Marginal Branch    Intervention   Prox Cx lesion  Stent (Also treats lesions: Mid Cx, and Mid Cx to Dist Cx)  Pre-stent angioplasty was performed. A drug-eluting stent was successfully placed. Stent strut is well apposed. Post-stent angioplasty was performed.  Post-Intervention Lesion Assessment  The intervention was successful. Pre-interventional TIMI flow is 3. Post-intervention TIMI flow is 3.  There is a 0% residual stenosis post intervention.    Mid Cx lesion  Stent (Also treats lesions: Prox Cx, and Mid Cx to Dist Cx)  See details in Prox Cx lesion.  Post-Intervention Lesion Assessment  The intervention was successful. Pre-interventional TIMI flow is 3. Post-intervention TIMI flow is 3. No complications occurred at this lesion.  There is a 0% residual stenosis post intervention.    Mid Cx to Dist Cx lesion  Stent (Also treats lesions: Prox Cx, and Mid Cx)  See details in Prox Cx lesion.  Post-Intervention Lesion Assessment  The intervention was successful. Pre-interventional TIMI flow is 3. Post-intervention TIMI flow is 3. No complications occurred at this lesion.  There is a 0% residual stenosis post intervention.     Left Heart  Left Ventricle LVEDP 16 mm Hg.   Coronary Diagrams  Diagnostic Dominance: Right  Intervention         Physical Exam:   VS:  BP (!) 112/58   Pulse 87   Ht 5' 4 (1.626 m)   Wt 141 lb (64 kg)   BMI 24.20 kg/m    Wt Readings from Last 3 Encounters:  07/08/24 141 lb (64 kg)  05/30/24 138 lb (62.6 kg)  09/10/23 136 lb (61.7 kg)    GEN: Well nourished, well developed in no acute distress NECK: No JVD; No carotid bruits CARDIAC: RRR, no murmurs, rubs, gallops RESPIRATORY:  Clear to auscultation without rales, wheezing or rhonchi  ABDOMEN: Soft, non-tender, non-distended EXTREMITIES:  No edema; No deformity   ASSESSMENT AND PLAN: .    Palpitations Palpitations occur before bed with dizziness and head  tightness. Blood pressure normal. Differential includes PACs or PVCs. Metoprolol  effective for blood pressure. Discussed potential triggers and electrolyte/thyroid role. Heart monitor considered for frequency and impact assessment. Explained frequent palpitations' potential effect on heart function. - Order lab tests: CBC, BMP, TSH, MAG - Order a 14-day heart monitor to assess frequency and nature of palpitations. - Advise limiting caffeine and alcohol intake. - Encourage adequate hydration, especially during hot weather and exercise. - Advise use of electrolyte drinks if sweating for more than an hour during exercise.  Essential hypertension Blood pressure well-controlled with current and previous readings within target range. Current regimen includes lisinopril  HCTZ, metoprolol , Crestor , Plavix , and aspirin . - Continue current antihypertensive medications: lisinopril  HCTZ, metoprolol . - Monitor blood pressure and report if systolic exceeds 864 mmHg or if it drops too low. - Refill prescriptions for lisinopril  HCTZ, metoprolol , Crestor , Plavix , and nitroglycerin .  CAD status post circumflex stent -no CP today -Continue current medications including aspirin  81 mg daily, Plavix  75mg , lisinopril /HCTZ 10/12.5 mg  tablet metoprolol  succinate 12.5 mg daily, nitro as needed, Protonix  40 mg daily, and Crestor  20 mg daily  Hyperlipidemia -Continue Crestor  40 mg daily -Most recent lipid panel was at goal -repeat at next visit     Dispo: He can follow-up in 6 months with Dr. Jeffrie or me  Signed, Orren LOISE Fabry, PA-C

## 2024-07-08 ENCOUNTER — Ambulatory Visit

## 2024-07-08 ENCOUNTER — Ambulatory Visit: Attending: Physician Assistant | Admitting: Physician Assistant

## 2024-07-08 VITALS — BP 112/58 | HR 87 | Ht 64.0 in | Wt 141.0 lb

## 2024-07-08 DIAGNOSIS — R079 Chest pain, unspecified: Secondary | ICD-10-CM | POA: Diagnosis not present

## 2024-07-08 DIAGNOSIS — E785 Hyperlipidemia, unspecified: Secondary | ICD-10-CM

## 2024-07-08 DIAGNOSIS — Z955 Presence of coronary angioplasty implant and graft: Secondary | ICD-10-CM

## 2024-07-08 DIAGNOSIS — R072 Precordial pain: Secondary | ICD-10-CM

## 2024-07-08 DIAGNOSIS — I10 Essential (primary) hypertension: Secondary | ICD-10-CM

## 2024-07-08 DIAGNOSIS — I2510 Atherosclerotic heart disease of native coronary artery without angina pectoris: Secondary | ICD-10-CM

## 2024-07-08 MED ORDER — METOPROLOL SUCCINATE ER 25 MG PO TB24: 12.5000 mg | ORAL_TABLET | Freq: Every day | ORAL | 3 refills | Status: AC

## 2024-07-08 MED ORDER — LISINOPRIL-HYDROCHLOROTHIAZIDE 10-12.5 MG PO TABS: 1.0000 | ORAL_TABLET | Freq: Every day | ORAL | 3 refills | Status: AC

## 2024-07-08 MED ORDER — ROSUVASTATIN CALCIUM 40 MG PO TABS
40.0000 mg | ORAL_TABLET | Freq: Every day | ORAL | 3 refills | Status: AC
Start: 2024-07-08 — End: ?

## 2024-07-08 MED ORDER — CLOPIDOGREL BISULFATE 75 MG PO TABS: ORAL_TABLET | ORAL | 3 refills | Status: DC

## 2024-07-08 MED ORDER — NITROGLYCERIN 0.4 MG SL SUBL: 0.4000 mg | SUBLINGUAL_TABLET | SUBLINGUAL | 5 refills | Status: AC | PRN

## 2024-07-08 NOTE — Progress Notes (Unsigned)
 Enrolled patient for a 14 day Zio XT monitor to be mailed to patients home  [place] to read

## 2024-07-08 NOTE — Patient Instructions (Signed)
 Medication Instructions:  Your physician recommends that you continue on your current medications as directed. Please refer to the Current Medication list given to you today.  *If you need a refill on your cardiac medications before your next appointment, please call your pharmacy*  Lab Work: TODAY: TSH, CBC, BMET, MAGNESIUM  If you have labs (blood work) drawn today and your tests are completely normal, you will receive your results only by: MyChart Message (if you have MyChart) OR A paper copy in the mail If you have any lab test that is abnormal or we need to change your treatment, we will call you to review the results.  Testing/Procedures: ZIO AT Long term monitor-Live Telemetry  Your physician has requested you wear a ZIO patch monitor for 14 days.  This is a single patch monitor. [employer] supplies one patch monitor per enrollment. Additional  stickers are not available.  Please do not apply patch if you will be having a Nuclear Stress Test, Echocardiogram, Cardiac CT, MRI,  or Chest Xray during the period you would be wearing the monitor. The patch cannot be worn during  these tests. You cannot remove and re-apply the ZIO AT patch monitor.  Your ZIO patch monitor will be mailed 3 day USPS to your address on file. It may take 3-5 days to  receive your monitor after you have been enrolled.  Once you have received your monitor, please review the enclosed instructions. Your monitor has  already been registered assigning a specific monitor serial # to you.   Billing and Patient Assistance Program information  [place] has been supplied with any insurance information on record for billing. [employer] offers a sliding scale Patient Assistance Program for patients without insurance, or whose  insurance does not completely cover the cost of the ZIO patch monitor. You must apply for the  Patient Assistance Program to qualify for the discounted rate. To apply, call [employer] at [phone],   select option 4, select option 2 , ask to apply for the Patient Assistance Program, (you can request an  interpreter if needed). [employer] will ask your household income and how many people are in your  household. [employer] will quote your out-of-pocket cost based on this information. They will also be able  to set up a 12 month interest free payment plan if needed.  Applying the monitor   Shave hair from upper left chest.  Hold the abrader disc by orange tab. Rub the abrader in 40 strokes over left upper chest as indicated in  your monitor instructions.  Clean area with 4 enclosed alcohol pads. Use all pads to ensure the area is cleaned thoroughly. Let  dry.  Apply patch as indicated in monitor instructions. Patch will be placed under collarbone on left side of  chest with arrow pointing upward.  Rub patch adhesive wings for 2 minutes. Remove the white label marked 1. Remove the white label  marked 2. Rub patch adhesive wings for 2 additional minutes.  While looking in a mirror, press and release button in center of patch. A small green light will flash 3-4  times. This will be your only indicator that the monitor has been turned on.  Do not shower for the first 24 hours. You may shower after the first 24 hours.  Press the button if you feel a symptom. You will hear a small click. Record Date, Time and Symptom in  the Patient Log.   Starting the Gateway  In your kit there is a  small plastic box the size of a cellphone. This is Buyer, retail. It transmits all your  recorded data to [facility]. This box must always stay within 10 feet of you. Open the box and push the *  button. There will be a light that blinks orange and then green a few times. When the light stops  blinking, the Gateway is connected to the ZIO patch. Call [employer] at [phone] to confirm your monitor is transmitting.  Returning your monitor  Remove your patch and place it inside the Gateway. In the lower half of the  Gateway there is a white  bag with prepaid postage on it. Place Gateway in bag and seal. Mail package back to [place] as soon as  possible. Your physician should have your final report approximately 7 days after you have mailed back  your monitor. Call [facility] Customer Care at [phone] if you have questions regarding your ZIO AT  patch monitor. Call them immediately if you see an orange light blinking on your monitor.  If your monitor falls off in less than 4 days, contact our Monitor department at [phone]. If your  monitor becomes loose or falls off after 4 days call [employer] at [phone] for suggestions on  securing your monitor  Follow-Up: At [facility], you and your health needs are our priority.  As part of our continuing mission to provide you with exceptional heart care, our providers are all part of one team.  This team includes your primary Cardiologist (physician) and Advanced Practice Providers or APPs (Physician Assistants and Nurse Practitioners) who all work together to provide you with the care you need, when you need it.  Your next appointment:   6 month(s)  Provider:   Oneil Parchment, MD   We recommend signing up for the patient portal called MyChart.  Sign up information is provided on this After Visit Summary.  MyChart is used to connect with patients for Virtual Visits (Telemedicine).  Patients are able to view lab/test results, encounter notes, upcoming appointments, etc.  Non-urgent messages can be sent to your provider as well.   To learn more about what you can do with MyChart, go to [email or web address].

## 2024-07-09 ENCOUNTER — Ambulatory Visit: Payer: Self-pay | Admitting: Physician Assistant

## 2024-07-09 LAB — TSH: TSH: 2.35 u[IU]/mL (ref 0.450–4.500)

## 2024-07-30 DIAGNOSIS — I2510 Atherosclerotic heart disease of native coronary artery without angina pectoris: Secondary | ICD-10-CM | POA: Diagnosis not present

## 2024-07-30 DIAGNOSIS — R079 Chest pain, unspecified: Secondary | ICD-10-CM | POA: Diagnosis not present

## 2024-08-06 DIAGNOSIS — R079 Chest pain, unspecified: Secondary | ICD-10-CM | POA: Diagnosis not present

## 2024-08-06 DIAGNOSIS — I2510 Atherosclerotic heart disease of native coronary artery without angina pectoris: Secondary | ICD-10-CM | POA: Diagnosis not present

## 2024-08-06 DIAGNOSIS — R072 Precordial pain: Secondary | ICD-10-CM | POA: Diagnosis not present

## 2024-08-06 DIAGNOSIS — Z955 Presence of coronary angioplasty implant and graft: Secondary | ICD-10-CM

## 2024-08-08 NOTE — Progress Notes (Signed)
 Patient viewed in [place]  of his monitor    Last read by Clarkson Norrod at 12:13PM on 08/08/2024.

## 2024-08-15 ENCOUNTER — Ambulatory Visit: Admitting: Cardiology

## 2024-10-22 ENCOUNTER — Other Ambulatory Visit (HOSPITAL_COMMUNITY): Payer: Self-pay

## 2024-10-22 ENCOUNTER — Telehealth: Payer: Self-pay

## 2024-10-22 NOTE — Telephone Encounter (Signed)
 Pharmacy Patient Advocate Encounter   Received notification from CoverMyMeds that prior authorization for Pantoprazole  Sodium 40MG  dr tablets is required/requested.   Insurance verification completed.   The patient is insured through [facility].   Per test claim: Patient has not been seen in almost 2 years and an office visit is required for submission.

## 2024-10-22 NOTE — Telephone Encounter (Signed)
 Noted pt aware to call office for appt

## 2024-10-25 ENCOUNTER — Other Ambulatory Visit: Payer: Self-pay | Admitting: Family Medicine

## 2024-10-25 DIAGNOSIS — K219 Gastro-esophageal reflux disease without esophagitis: Secondary | ICD-10-CM

## 2024-10-27 ENCOUNTER — Telehealth: Payer: Self-pay

## 2024-10-27 ENCOUNTER — Other Ambulatory Visit (HOSPITAL_COMMUNITY): Payer: Self-pay

## 2024-10-27 NOTE — Telephone Encounter (Signed)
 Pharmacy Patient Advocate Encounter   Received notification from Patient Pharmacy that prior authorization for PANTOPRAZOLE  40MG  is required/requested.   Insurance verification completed.   The patient is insured through [facility].   Per test claim: Patient taking 1 tablet twice daily.  Once daily covered.   Per insurance: QTY LIMIT EXCEEDED. PA REQ'D. MAX QTY OF 90 IN 365 DAYS. QUANTITY REMAINING 30. NEXT FILL DATE 2025-05-30, LAST FILL DATE 2024-05-30 [facility].  Will attempt PA.  PA started via CoverMyMeds. KEY [id number]. Waiting for clinical questions to populate.

## 2024-10-27 NOTE — Telephone Encounter (Signed)
 Pharmacy Patient Advocate Encounter  Received notification from [facility] that Prior Authorization for PANTOPRAZOLE  40MG  has been CANCELLED due to  A PA is already in process for this member/drug. For further inquiries please contact the number on the back of the member prescription card.

## 2024-11-03 ENCOUNTER — Other Ambulatory Visit (HOSPITAL_COMMUNITY): Payer: Self-pay

## 2024-11-04 ENCOUNTER — Other Ambulatory Visit (HOSPITAL_COMMUNITY): Payer: Self-pay

## 2024-11-04 NOTE — Telephone Encounter (Signed)
 Pharmacy Patient Advocate Encounter  Received notification from [facility] that Prior Authorization for PANTOPRAZOLE  40MG  has been APPROVED from 11/04/24 to 11/04/25   PA #/Case ID/Reference #: [id number]

## 2024-11-04 NOTE — Telephone Encounter (Signed)
 Prior PA closed 10/27/24. Was able to resubmit.   Prior authorization submitted for PANTOPRAZOLE  40MG  to [facility] via Latent.   Key: [id number]

## 2024-11-10 ENCOUNTER — Other Ambulatory Visit: Payer: Self-pay

## 2024-11-11 MED ORDER — CLOPIDOGREL BISULFATE 75 MG PO TABS: ORAL_TABLET | ORAL | 2 refills | Status: AC
